# Patient Record
Sex: Female | Born: 1975 | Race: White | Hispanic: No | Marital: Married | Smoking: Former smoker
Health system: Southern US, Community
[De-identification: ages and names within clinical notes are randomized; demographics above are authoritative.]

## PROBLEM LIST (undated history)

## (undated) DIAGNOSIS — F419 Anxiety disorder, unspecified: Secondary | ICD-10-CM

## (undated) HISTORY — PX: APPENDECTOMY: SHX54

## (undated) HISTORY — DX: Anxiety disorder, unspecified: F41.9

---

## 2015-10-15 ENCOUNTER — Ambulatory Visit: Payer: Self-pay | Admitting: Internal Medicine

## 2016-02-04 ENCOUNTER — Ambulatory Visit (HOSPITAL_COMMUNITY)
Admission: EM | Admit: 2016-02-04 | Discharge: 2016-02-04 | Disposition: A | Discharge status: Home / Self Care | Payer: Managed Care, Other (non HMO) | Attending: Internal Medicine | Admitting: Internal Medicine

## 2016-02-04 ENCOUNTER — Encounter (HOSPITAL_COMMUNITY): Payer: Self-pay | Admitting: Emergency Medicine

## 2016-02-04 DIAGNOSIS — J028 Acute pharyngitis due to other specified organisms: Secondary | ICD-10-CM | POA: Diagnosis not present

## 2016-02-04 MED ORDER — AMOXICILLIN 500 MG PO CAPS: 500.0000 mg | ORAL_CAPSULE | Freq: Three times a day (TID) | ORAL | 0 refills | Status: DC

## 2016-02-04 NOTE — ED Provider Notes (Signed)
CSN: [id number]     Arrival date & time 02/04/16  1710 History   None    Chief Complaint  Patient presents with  . Sore Throat   (Consider location/radiation/quality/duration/timing/severity/associated sxs/prior Treatment) The history is provided by the patient. No language interpreter was used.  Sore Throat  This is a new problem. The current episode started more than 2 days ago. The problem occurs constantly. The problem has been gradually worsening. Nothing aggravates the symptoms. Nothing relieves the symptoms. She has tried nothing for the symptoms. The treatment provided no relief.  Pt complains of a sore throat since Sunday.  Pt's husband has strep  Past Medical History:  Diagnosis Date  . Anxiety    Past Surgical History:  Procedure Laterality Date  . APPENDECTOMY     No family history on file. Social History  Substance Use Topics  . Smoking status: Former Research scientist (life sciences)  . Smokeless tobacco: Not on file  . Alcohol use 0.0 oz/week     Comment: rarely   OB History    No data available     Review of Systems  All other systems reviewed and are negative.   Allergies  Review of patient's allergies indicates no known allergies.  Home Medications   Prior to Admission medications   Medication Sig Start Date End Date Taking? Authorizing Provider  ibuprofen (ADVIL,MOTRIN) 200 MG tablet Take 400 mg by mouth every 6 (six) hours as needed for fever.   Yes Historical Provider, MD   Meds Ordered and Administered this Visit  Medications - No data to display  BP 104/76 (BP Location: Left Arm)   Pulse 80   Temp 98.1 F (36.7 C) (Oral)   SpO2 100%  No data found.   Physical Exam  Constitutional: She is oriented to person, place, and time. She appears well-developed and well-nourished.  HENT:  Head: Normocephalic.  Erythema posterior pharynx.  No esudate  Eyes: EOM are normal.  Neck: Normal range of motion.  Pulmonary/Chest: Effort normal.  Abdominal: She exhibits no  distension.  Musculoskeletal: Normal range of motion.  Neurological: She is alert and oriented to person, place, and time.  Psychiatric: She has a normal mood and affect.  Nursing note and vitals reviewed.   Urgent Care Course   Clinical Course    Procedures (including critical care time)  Labs Review Labs Reviewed - No data to display  Imaging Review No results found.   Visual Acuity Review  Right Eye Distance:   Left Eye Distance:   Bilateral Distance:    Right Eye Near:   Left Eye Near:    Bilateral Near:         MDM   1. Pharyngitis due to other organism    An After Visit Summary was printed and given to the patient. Meds ordered this encounter  Medications  . ibuprofen (ADVIL,MOTRIN) 200 MG tablet    Sig: Take 400 mg by mouth every 6 (six) hours as needed for fever.  Marland Kitchen amoxicillin (AMOXIL) 500 MG capsule    Sig: Take 1 capsule (500 mg total) by mouth 3 (three) times daily.    Dispense:  30 capsule    Refill:  0    Order Specific Question:   Supervising Provider    Answer:   Ihor Gully D Burgoon, PA-C 02/04/16 1757

## 2016-02-04 NOTE — ED Triage Notes (Signed)
Pt has been suffering from a sore throat since Sunday.  She states that her daughter and husband both have or had strep throat in the last two weeks.  Pt reports an unmeasured fever at home.

## 2016-06-03 ENCOUNTER — Encounter (INDEPENDENT_AMBULATORY_CARE_PROVIDER_SITE_OTHER): Payer: Self-pay | Admitting: Surgery

## 2016-06-03 ENCOUNTER — Ambulatory Visit (INDEPENDENT_AMBULATORY_CARE_PROVIDER_SITE_OTHER): Payer: Self-pay

## 2016-06-03 ENCOUNTER — Ambulatory Visit (INDEPENDENT_AMBULATORY_CARE_PROVIDER_SITE_OTHER): Payer: Managed Care, Other (non HMO) | Admitting: Surgery

## 2016-06-03 VITALS — BP 109/72 | HR 76 | Ht 66.93 in | Wt 132.3 lb

## 2016-06-03 DIAGNOSIS — M5441 Lumbago with sciatica, right side: Secondary | ICD-10-CM

## 2016-06-03 DIAGNOSIS — M25551 Pain in right hip: Secondary | ICD-10-CM | POA: Diagnosis not present

## 2016-06-03 DIAGNOSIS — G8929 Other chronic pain: Secondary | ICD-10-CM

## 2016-06-03 MED ORDER — METHYLPREDNISOLONE 4 MG PO TABS: ORAL_TABLET | ORAL | 0 refills | Status: DC

## 2016-06-03 MED ORDER — METHOCARBAMOL 500 MG PO TABS: 500.0000 mg | ORAL_TABLET | Freq: Three times a day (TID) | ORAL | 0 refills | Status: DC | PRN

## 2016-06-03 NOTE — Progress Notes (Signed)
Office Visit Note   Patient: Mary Boyle           Date of Birth: 1975-06-10           MRN: [id number] Visit Date: 06/03/2016              Requested by: No referring provider defined for this encounter. PCP: No PCP Per Patient   Assessment & Plan: Visit Diagnoses:  1. Chronic right-sided low back pain with right-sided sciatica     Plan: Patient given prescriptions for Medrol Dosepak 6 day taper to be taken as directed and Robaxin she will also go to a couple of visits of formal PT to be given a home exercise program. Follow-up in 2 weeks for recheck. If she continues to be symptomatic we will schedule a lumbar spine MRI to rule out HNP/stenosis. All questions answered.  Follow-Up Instructions: Return in about 2 weeks (around 06/17/2016) for [facility].   Orders:  Orders Placed This Encounter  Procedures  . XR Pelvis 1-2 Views  . XR Lumbar Spine 2-3 Views   Meds ordered this encounter  Medications  . methylPREDNISolone (MEDROL) 4 MG tablet    Sig: 6 day taper to be taken as directed    Dispense:  21 tablet    Refill:  0  . methocarbamol (ROBAXIN) 500 MG tablet    Sig: Take 1 tablet (500 mg total) by mouth every 8 (eight) hours as needed for muscle spasms.    Dispense:  50 tablet    Refill:  0      Procedures: No procedures performed   Clinical Data: No additional findings.   Subjective: Chief Complaint  Patient presents with  . Lower Back - Pain    Patient presents with low back pain that radiates down to right knee. She states that she has had back pain that comes and goes since the birth of her second daughter in 2014. The pain has become worse and constant over the last two weeks. She went to a chiropractor and tried a yoga class which made it worse. She has tried ibuprofen and voltaren gel with no relief.   States that she's had off-and-on pain in her low back for a few years that was aggravated with certain activities. Mostly aggravated with prolonged  sitting. The last couple weeks she seen a chiropractor without any improvement. States that she has pain in the right lower back that radiates into her right buttock down to her knee. Again this is also aggravated with sitting. Not so bad with walking and lying flat. No symptoms in the left leg. She does yoga but some techniques to aggravate her back.  Review of Systems  Constitutional: Positive for activity change.  HENT: Negative.   Eyes: Negative.   Respiratory: Negative.   Cardiovascular: Negative.   Gastrointestinal: Negative.   Endocrine: Negative.   Genitourinary: Negative.   Allergic/Immunologic: Negative.   Psychiatric/Behavioral: Negative.      Objective: Vital Signs: BP 109/72   Pulse 76   Ht 5' 6.93" (1.7 m)   Wt 132 lb 4.4 oz (60 kg)   BMI 20.76 kg/m   Physical Exam  Constitutional: She is oriented to person, place, and time. No distress.  HENT:  Head: Normocephalic and atraumatic.  Eyes: EOM are normal. Pupils are equal, round, and reactive to light.  Neck: Normal range of motion.  Pulmonary/Chest: No respiratory distress.  Abdominal: She exhibits no distension.  Musculoskeletal:  Gait is normal. She has good  lumbar flexion and extension with hands to ankles. With flexion she does have some discomfort in the right low back. Mild right lumbar paraspinal tenderness. Negative logroll bilateral hips. Right straight leg raise causes pain in the central lumbar spine but nothing down the leg. Neurovascularly intact. No focal motor deficits. Bilateral calves are nontender.  Neurological: She is alert and oriented to person, place, and time.  Skin: Skin is warm and dry.  Psychiatric: She has a normal mood and affect.    Ortho Exam  Specialty Comments:  No specialty comments available.  Imaging: Xr Lumbar Spine 2-3 Views  Result Date: 06/03/2016 X-rays lumbar spine shows some narrowing of the L5-S1 disc space. She does have a few millimeters of L4 retrolisthesis that  does show some movement with flexion and extension. No acute findings.  Xr Pelvis 1-2 Views  Result Date: 06/03/2016 X-ray right hip shows good bony anatomy. Joint space maintained. No acute findings.    PMFS History: There are no active problems to display for this patient.  Past Medical History:  Diagnosis Date  . Anxiety     History reviewed. No pertinent family history.  Past Surgical History:  Procedure Laterality Date  . APPENDECTOMY     Social History   Occupational History  . Not on file.   Social History Main Topics  . Smoking status: Former Research scientist (life sciences)  . Smokeless tobacco: Never Used  . Alcohol use 0.0 oz/week     Comment: rarely  . Drug use: No  . Sexual activity: Not on file

## 2016-06-08 ENCOUNTER — Ambulatory Visit: Payer: Managed Care, Other (non HMO) | Attending: Orthopaedic Surgery | Admitting: Physical Therapy

## 2016-06-08 DIAGNOSIS — R293 Abnormal posture: Secondary | ICD-10-CM

## 2016-06-08 DIAGNOSIS — M5417 Radiculopathy, lumbosacral region: Secondary | ICD-10-CM | POA: Diagnosis not present

## 2016-06-08 DIAGNOSIS — M25651 Stiffness of right hip, not elsewhere classified: Secondary | ICD-10-CM

## 2016-06-08 NOTE — Therapy (Signed)
[place], [place], [address] Phone: [phone]   Fax:  [phone]  Physical Therapy Evaluation  Patient Details  Name: Mary Boyle MRN: [id number] Date of Birth: 1975-08-05 Referring Provider: Dr. Lorin Mercy  Encounter Date: 06/08/2016      PT End of Session - 06/08/16 1743    Visit Number 1   Number of Visits 8   Date for PT Re-Evaluation 07/13/16   PT Start Time 0800   PT Stop Time 0855   PT Time Calculation (min) 55 min   Activity Tolerance Patient tolerated treatment well   Behavior During Therapy [facility] for tasks assessed/performed      Past Medical History:  Diagnosis Date  . Anxiety     Past Surgical History:  Procedure Laterality Date  . APPENDECTOMY      There were no vitals filed for this visit.       Subjective Assessment - 06/08/16 0807    Subjective This patient reports long history of intermittent back pain.  She states her pain was worse after having her children. 3 weeks ago she had severe pain.    Yoga and chiropractic may have exacerbated.   She has radiating pain in Rt. LE, Rt. hip to knee. Mild numbness and tingling.  Stiff in prox. hip. She has difficulty sitting >standing, drivning in the car.     Pertinent History --   Limitations Sitting;Lifting;House hold activities;Other (comment)   Diagnostic tests XR showed retrolisthesis    Currently in Pain? Yes   Pain Score 4    Pain Location Back  HIp    Pain Orientation Right   Pain Descriptors / Indicators Tightness;Pressure   Pain Type Chronic pain   Pain Onset More than a month ago   Pain Frequency Intermittent   Aggravating Factors  sitting   Pain Relieving Factors heating pad, meds, Voltaren , has not been as effectiv as it has in the past.             [facility] PT Assessment - 06/08/16 0818      Assessment   Medical Diagnosis chronic low back pain with Rt. sciatica    Referring Provider Dr. Lorin Mercy   Onset  Date/Surgical Date --  acute on chronic 3 weeks    Prior Therapy In [place]     Precautions   Precautions None     Restrictions   Weight Bearing Restrictions No     Balance Screen   Has the patient fallen in the past 6 months No     Prior Function   Level of Independence Independent   Vocation Other (comment)   Vocation Requirements professor, research   Leisure playing with daughters, traveling     Cognition   Overall Cognitive Status Within Functional Limits for tasks assessed     Observation/Other Assessments   Focus on Therapeutic Outcomes (FOTO)  44%     Sensation   Light Touch Appears Intact     Posture/Postural Control   Posture/Postural Control Postural limitations   Postural Limitations Rounded Shoulders;Forward head;Decreased lumbar lordosis   Posture Comments Rt. knee valgus, patella face inward bilaterally, R>L.  In supine Rt. ASIS hgher      AROM   Lumbar Flexion 80   Lumbar Extension 40   Lumbar - Right Side Bend pain on R    Lumbar - Left Side Bend pain on R    Lumbar - Right Rotation 25% pain    Lumbar - Left  Rotation WNL      PROM   Overall PROM Comments Rt. hamstring to 55 deg, Lt 70 deg.      Strength   Right/Left Hip --  WNL    Right Hip ABduction 4+/5   Left Hip ABduction 5/5   Right/Left Knee --  WNL      Flexibility   Hamstrings 90/90 test  Rt. lacks 35-40 deg, L. 20-25 deg     Palpation   Palpation comment tender Rt. glute med and L5-S1 , min in lateral border SIJ      Special Tests    Special Tests --  neg Toy Care                   [facility] Adult PT Treatment/Exercise - 06/08/16 0818      Self-Care   Self-Care Heat/Ice Application;Other Self-Care Comments   Heat/Ice Application MHP    Other Self-Care Comments  try short 30 min session of Yoga      Lumbar Exercises: Stretches   Active Hamstring Stretch 2 reps;30 seconds   Lower Trunk Rotation 10 seconds   Lower Trunk Rotation Limitations x 10      Knee/Hip  Exercises: Stretches   ITB Stretch Both;2 reps     Moist Heat Therapy   Number Minutes Moist Heat 10 Minutes   Moist Heat Location Lumbar Spine                PT Education - 06/08/16 1958    Education provided Yes   Education Details PT/POC, HEP, retrolisthesis, posture, core    Person(s) Educated Patient   Methods Explanation;Demonstration;Verbal cues;Handout;Tactile cues   Comprehension Verbalized understanding;Returned demonstration;Need further instruction          PT Short Term Goals - 06/08/16 2001      PT SHORT TERM GOAL #1   Title STG =LTG           PT Long Term Goals - 06/08/16 2001      PT LONG TERM GOAL #1   Title Pt will be I with HEP for hips, core strength, flexibility and posture.    Time 4   Period Weeks   Status New     PT LONG TERM GOAL #2   Title Pt will score less than 30% impaired on FOTO to demo functional improvement.    Time 4   Period Weeks   Status New     PT LONG TERM GOAL #3   Title Pt wil demo lifting 30lbs from floor to waist height with good mechanics to prevent reinjury.    Time 4   Period Weeks   Status New     PT LONG TERM GOAL #4   Title Pt will sit for work, driving up to 30 min without increase in LE pain.    Time 4   Period Weeks   Status New     PT LONG TERM GOAL #5   Title Pt will report pain overall with daily activities <2/10 most of the time.    Time 4   Period Weeks   Status New               Plan - 06/08/16 1744    Clinical Impression Statement Patient presetns for low complexity eval of chronic intermittent radicular low back pain . Rt leg pain and sensory sx with sitting and about 2-3 weeks ago was constant.  Overall she is improving.  Possibly rotated in Rt. ilium but  may be improved with simple stretches given today.   She will do well with 4-8 PT sessions to teach proper recruitment of core mm, behavior and body mech, behavior modifcation.    Rehab Potential Excellent   PT Frequency 2x  / week   PT Duration 6 weeks   PT Treatment/Interventions Moist Heat;Manual techniques;Therapeutic exercise;Ultrasound;Balance training;Electrical Stimulation;Functional mobility training;Patient/family education;Passive range of motion;Neuromuscular re-education   PT Next Visit Plan check flexibility HEP and add in core.  try manual for Rt. hip, consider SIJ   PT Home Exercise Plan hamstring, piriformis, ITB    Consulted and Agree with Plan of Care Patient      Patient will benefit from skilled therapeutic intervention in order to improve the following deficits and impairments:  Pain, Decreased strength, Postural dysfunction, Impaired flexibility, Decreased range of motion, Increased fascial restricitons, Decreased mobility  Visit Diagnosis: Radiculopathy, lumbosacral region  Abnormal posture  Stiffness of right hip, not elsewhere classified     Problem List There are no active problems to display for this patient.   PAA,JENNIFER 06/08/2016, 8:07 PM  [place], [place], [address] Phone: [phone]   Fax:  [phone]  Name: Mary Boyle MRN: [id number] Date of Birth: [birth year]   Raeford Razor, PT 06/08/16 8:08 PM Phone: [phone] Fax: [phone]

## 2016-06-14 ENCOUNTER — Ambulatory Visit: Payer: Managed Care, Other (non HMO) | Admitting: Physical Therapy

## 2016-06-21 ENCOUNTER — Ambulatory Visit: Payer: Managed Care, Other (non HMO) | Admitting: Physical Therapy

## 2016-06-21 DIAGNOSIS — M5417 Radiculopathy, lumbosacral region: Secondary | ICD-10-CM

## 2016-06-21 DIAGNOSIS — M25651 Stiffness of right hip, not elsewhere classified: Secondary | ICD-10-CM

## 2016-06-21 DIAGNOSIS — R293 Abnormal posture: Secondary | ICD-10-CM

## 2016-06-21 NOTE — Therapy (Signed)
[place], [place], [address] Phone: [phone]   Fax:  [phone]  Physical Therapy Treatment  Patient Details  Name: Mary Boyle MRN: [id number] Date of Birth: 04-05-1976 Referring Provider: Dr. Lorin Mercy  Encounter Date: 06/21/2016      PT End of Session - 06/21/16 1030    Visit Number 2   Number of Visits 8   Date for PT Re-Evaluation 07/13/16   PT Start Time 0845   PT Stop Time 0940   PT Time Calculation (min) 55 min   Activity Tolerance Patient tolerated treatment well   Behavior During Therapy [facility] for tasks assessed/performed      Past Medical History:  Diagnosis Date  . Anxiety     Past Surgical History:  Procedure Laterality Date  . APPENDECTOMY      There were no vitals filed for this visit.      Subjective Assessment - 06/21/16 0847    Subjective Yesterday was very painful, weekend was OK.  I ran Sunday and they may have aggravted it.     Currently in Pain? Yes   Pain Score 3    Pain Location Hip   Pain Orientation Right   Pain Descriptors / Indicators Tightness   Pain Type Chronic pain   Pain Onset More than a month ago   Pain Frequency Intermittent   Aggravating Factors  sitting, driving in the car                          [facility] Adult PT Treatment/Exercise - 06/21/16 0852      Lumbar Exercises: Stretches   Active Hamstring Stretch 2 reps;30 seconds   Lower Trunk Rotation 10 seconds   Lower Trunk Rotation Limitations x 10      Lumbar Exercises: Aerobic   Stationary Bike level 3 for 6 min warm up      Lumbar Exercises: Prone   Other Prone Lumbar Exercises Tr A in prone x 10    Other Prone Lumbar Exercises add in pelvic press series: knee flexion and hip ext for stabilization      Lumbar Exercises: Quadruped   Straight Leg Raise 5 reps   Opposite Arm/Leg Raise Right arm/Left leg;Left arm/Right leg;10 reps   Other Quadruped Lumbar Exercises childs  pose    Other Quadruped Lumbar Exercises much more challenging to Rt. multifidus/stab with Rt. leg extended      Knee/Hip Exercises: Stretches   ITB Stretch Both;2 reps   Other Knee/Hip Stretches sidelying Rt. trunk stretch over pressure      Moist Heat Therapy   Number Minutes Moist Heat 10 Minutes   Moist Heat Location Lumbar Spine;Hip     Manual Therapy   Manual Therapy Joint mobilization   Joint Mobilization ant hip mob in prone, PROM to Rt. hip in ER and IR , long axis distraction in prone to Rt. LE                 PT Education - 06/21/16 0858    Education provided Yes   Education Details HEP review and core    Person(s) Educated Patient   Methods Explanation;Demonstration   Comprehension Verbalized understanding;Returned demonstration          PT Short Term Goals - 06/08/16 2001      PT SHORT TERM GOAL #1   Title STG =LTG           PT Long Term  Goals - 06/21/16 1033      PT LONG TERM GOAL #1   Title Pt will be I with HEP for hips, core strength, flexibility and posture.    Status On-going     PT LONG TERM GOAL #2   Title Pt will score less than 30% impaired on FOTO to demo functional improvement.    Status On-going     PT LONG TERM GOAL #3   Title Pt wil demo lifting 30lbs from floor to waist height with good mechanics to prevent reinjury.    Status On-going     PT LONG TERM GOAL #4   Title Pt will sit for work, driving up to 30 min without increase in LE pain.    Status On-going     PT LONG TERM GOAL #5   Title Pt will report pain overall with daily activities <2/10 most of the time.    Status On-going               Plan - 06/21/16 1030    Clinical Impression Statement Pt continues to improve.  Noticed tightness in Rt. hip ER >Lt. today, addressed with manual and stretching.  Core work in quadruped was revealing about weakness of Rt. multifidus.  Needs cues to avoid thoracic flexion to engage abdominals and also to avoid  hyperextension of Lumbar spine with hip extension.  Advised to stop running as she does not seem to have adequate stability of spine. Seeing privte Yoga instructor as well.     PT Next Visit Plan core stability  try manual for Rt. hip, consider SIJ   PT Home Exercise Plan hamstring, piriformis, ITB    Consulted and Agree with Plan of Care Patient      Patient will benefit from skilled therapeutic intervention in order to improve the following deficits and impairments:  Pain, Decreased strength, Postural dysfunction, Impaired flexibility, Decreased range of motion, Increased fascial restricitons, Decreased mobility  Visit Diagnosis: Radiculopathy, lumbosacral region  Abnormal posture  Stiffness of right hip, not elsewhere classified     Problem List There are no active problems to display for this patient.   Nevia Henkin 06/21/2016, 10:36 AM  [place], [place], [address] Phone: [phone]   Fax:  [phone]  Name: Cathy Ravert MRN: [id number] Date of Birth: 06/16/1975  Raeford Razor, PT 06/21/16 10:37 AM Phone: [phone] Fax: [phone]

## 2016-06-21 NOTE — Patient Instructions (Signed)
Bracing With Leg Raise (Quadruped)    On hands and knees find neutral spine. Tighten pelvic floor and abdominals and hold. Alternating legs, straighten and lift to hip level. Repeat _10__ times. Do 1___ times a day.   Copyright  VHI. All rights reserved.    Healthy Back Strengthening - Back Extension on All Fours    Start on hands and knees, keeping them apart. Straighten right leg and left arm at the same time. Hold __5__ seconds. Switch immediately and repeat with left leg and right arm. Do ___10_ times. Increase repetitions gradually up to ____. Increase each hold gradually up to ___10_ seconds.  Copyright  VHI. All rights reserved.

## 2016-06-22 ENCOUNTER — Ambulatory Visit (INDEPENDENT_AMBULATORY_CARE_PROVIDER_SITE_OTHER): Payer: Managed Care, Other (non HMO) | Admitting: Orthopaedic Surgery

## 2016-06-28 ENCOUNTER — Ambulatory Visit: Payer: Managed Care, Other (non HMO) | Attending: Orthopaedic Surgery | Admitting: Physical Therapy

## 2016-06-28 DIAGNOSIS — M5417 Radiculopathy, lumbosacral region: Secondary | ICD-10-CM | POA: Diagnosis present

## 2016-06-28 DIAGNOSIS — M25651 Stiffness of right hip, not elsewhere classified: Secondary | ICD-10-CM | POA: Diagnosis present

## 2016-06-28 DIAGNOSIS — R293 Abnormal posture: Secondary | ICD-10-CM | POA: Insufficient documentation

## 2016-06-28 NOTE — Patient Instructions (Addendum)
Shoulder bridge with band around thighs to incr ER strength

## 2016-06-28 NOTE — Therapy (Addendum)
[place], [place], [address] Phone: [phone]   Fax:  [phone]  Physical Therapy Treatment and Discharge   Patient Details  Name: Mary Boyle MRN: [id number] Date of Birth: 20-May-1975 Referring Provider: Dr. Lorin Mercy  Encounter Date: 06/28/2016      PT End of Session - 06/28/16 0855    Visit Number 3   Number of Visits 8   Date for PT Re-Evaluation 07/13/16   PT Start Time 0849   PT Stop Time 0935   PT Time Calculation (min) 46 min   Activity Tolerance Patient tolerated treatment well   Behavior During Therapy [facility] for tasks assessed/performed      Past Medical History:  Diagnosis Date  . Anxiety     Past Surgical History:  Procedure Laterality Date  . APPENDECTOMY      There were no vitals filed for this visit.      Subjective Assessment - 06/28/16 0850    Subjective Pain has moved to lumbar spine Rt. lateral.  Was sitting in office for 4 hours.  This weekend was fine, original pain is different.     Currently in Pain? Yes   Pain Score 4    Pain Location Back   Pain Orientation Right   Pain Descriptors / Indicators Pressure   Pain Type Chronic pain   Pain Onset More than a month ago   Pain Frequency Intermittent   Aggravating Factors  sitting   Pain Relieving Factors exercises are helping               [facility] Adult PT Treatment/Exercise - 06/28/16 0903      Ambulation/Gait   Gait Comments assessment of LE alignment      Posture/Postural Control   Posture Comments Rt femur IR      Lumbar Exercises: Supine   Ab Set 10 reps   Clam 10 reps   Clam Limitations unilateal and bilateral    Heel Slides 10 reps   Bent Knee Raise 10 reps   Bridge --   Straight Leg Raise 10 reps     Manual Therapy   Soft tissue mobilization Rt. QL    Myofascial Release Rt. trunk in sidelying over pillow to stretch caudal pressure to iliac crest       Pilates Reformer used for LE/core  strength, postural strength, lumbopelvic disassociation and core control.  Exercises included:  Footwork 2 Red 1 blue heels used ball x 10 and used green band to encourage Rt. Hip ER and proper alignment.  Single leg work as well, needed mod cues to maintain stable pelvis and lumbar spine.  Bridging 3 springs with band around thighs  Felt good to patient            PT Education - 06/28/16 0941    Education provided Yes   Education Details LE alignment and core    Person(s) Educated Patient   Methods Explanation   Comprehension Verbalized understanding;Returned demonstration          PT Short Term Goals - 06/08/16 2001      PT SHORT TERM GOAL #1   Title STG =LTG           PT Long Term Goals - 06/28/16 0944      PT LONG TERM GOAL #1   Title Pt will be I with HEP for hips, core strength, flexibility and posture.    Status On-going     PT LONG TERM GOAL #  2   Title Pt will score less than 30% impaired on FOTO to demo functional improvement.    Status On-going     PT LONG TERM GOAL #3   Title Pt wil demo lifting 30lbs from floor to waist height with good mechanics to prevent reinjury.    Status On-going     PT LONG TERM GOAL #4   Title Pt will sit for work, driving up to 30 min without increase in LE pain.    Baseline pain in L spine but less in hip /leg    Status Partially Met     PT LONG TERM GOAL #5   Title Pt will report pain overall with daily activities <2/10 most of the time.    Baseline improving    Status On-going               Plan - 06/28/16 7322    Clinical Impression Statement Pt made aware of Rt. LE alignment issues, used mirror to assist.  Rt. femur rotates inward with gait.  Compensates with lumbar spine.  Used Pilates Reformer for Proprioception and stabilization of pelvis.      PT Next Visit Plan core stability  try manual for Rt. hip, consider SIJ   PT Home Exercise Plan hamstring, piriformis, ITB , bridge with band    Consulted and  Agree with Plan of Care Patient      Patient will benefit from skilled therapeutic intervention in order to improve the following deficits and impairments:  Pain, Decreased strength, Postural dysfunction, Impaired flexibility, Decreased range of motion, Increased fascial restricitons, Decreased mobility  Visit Diagnosis: Radiculopathy, lumbosacral region  Abnormal posture  Stiffness of right hip, not elsewhere classified     Problem List There are no active problems to display for this patient.   PAA,JENNIFER 06/28/2016, 9:50 AM  [facility] [address] [place], [place], [address] Phone: [phone]   Fax:  [phone]  Name: Mary Boyle MRN: [id number] Date of Birth: [birth year]  Raeford Razor, PT 06/28/16 9:52 AM Phone: [phone] Fax: [phone]  PHYSICAL THERAPY DISCHARGE SUMMARY  Visits from Start of Care: 3  Current functional level related to goals / functional outcomes: See above for most recent info   Remaining deficits: See above, pain and postural imbalance, hip weakness    Education / Equipment: HEP, Pilates, safety with yoga and other exercises, body mechanics , posture  Plan: Patient agrees to discharge.  Patient goals were partially met. Patient is being discharged due to not returning since the last visit.  ?????    Patient did not call, reschedule appt.   Raeford Razor, PT 08/03/16 3:01 PM Phone: [phone] Fax: [phone]

## 2016-07-05 ENCOUNTER — Ambulatory Visit: Payer: Managed Care, Other (non HMO) | Admitting: Physical Therapy

## 2016-07-11 ENCOUNTER — Ambulatory Visit: Payer: Managed Care, Other (non HMO) | Admitting: Physical Therapy

## 2016-08-03 ENCOUNTER — Ambulatory Visit (INDEPENDENT_AMBULATORY_CARE_PROVIDER_SITE_OTHER): Payer: Managed Care, Other (non HMO) | Admitting: Orthopaedic Surgery

## 2016-08-03 ENCOUNTER — Encounter (INDEPENDENT_AMBULATORY_CARE_PROVIDER_SITE_OTHER): Payer: Self-pay | Admitting: Orthopaedic Surgery

## 2016-08-03 VITALS — BP 117/74 | HR 64 | Ht 66.93 in | Wt 132.3 lb

## 2016-08-03 DIAGNOSIS — M545 Low back pain: Secondary | ICD-10-CM | POA: Diagnosis not present

## 2016-08-03 NOTE — Progress Notes (Signed)
Office Visit Note   Patient: Mary Boyle           Date of Birth: [birth year]           MRN: [id number] Visit Date: 08/03/2016              Requested by: No referring provider defined for this encounter. PCP: No PCP Per Patient   Assessment & Plan: Visit Diagnoses:  1. Acute right-sided low back pain, with sciatica presence unspecified     Plan: Patient's about 75% better. Certain workout activity still bothered to some degree. We discussed activity modification. She is doing her yoga and states that the remaining discomfort she can handle. We reviewed her MRI that showed some disc space narrowing at L5-S1 with trace listhesis. She can return if she gets increase in her symptoms.  Follow-Up Instructions: Return if symptoms worsen or fail to improve.   Orders:  No orders of the defined types were placed in this encounter.  No orders of the defined types were placed in this encounter.     Procedures: No procedures performed   Clinical Data: No additional findings.   Subjective: Chief Complaint  Patient presents with  . Lower Back - Pain    HPI patient still having some right sided low back pain and right leg pain. She states a few weeks after the prednisone pack she got better. She's gotten 75% relief in his back doing yoga. She denies bowel bladder symptoms no fever chills. She notices when she stretches her hamstring on the right leg she still has some pain that radiates into her thigh and down into her leg. She is doing normal activities of daily living without symptoms.  Review of Systems 14 point review of systems updated and is unchanged from 06/03/2016 office visit other than as stated above. Activity level is improved she 75% pain relief.   Objective: Vital Signs: BP 117/74   Pulse 64   Ht 5' 6.93" (1.7 m)   Wt 132 lb 4.4 oz (60 kg)   BMI 20.76 kg/m   Physical Exam  Constitutional: She is oriented to person, place, and time. She appears well-developed.   HENT:  Head: Normocephalic.  Right Ear: External ear normal.  Left Ear: External ear normal.  Eyes: Pupils are equal, round, and reactive to light.  Neck: No tracheal deviation present. No thyromegaly present.  Cardiovascular: Normal rate.   Pulmonary/Chest: Effort normal.  Abdominal: Soft.  Musculoskeletal:  Reflexes are intact pelvis is level there siding notch tenderness on the right none on the left. She still has some tenderness along the sciatic nerve and the posterior thigh with straight leg raising. Positive pop to compression test. No gastrocsoleus or anterior tib weakness. Sensation of the foot is intact. Peroneal nerve at the knee is normal. Normal hip range of motion negative Faber test. No rash or exposed skin.  Neurological: She is alert and oriented to person, place, and time.  Skin: Skin is warm and dry.  Psychiatric: She has a normal mood and affect. Her behavior is normal.    Ortho Exam  Specialty Comments:  No specialty comments available.  Imaging: No results found.   PMFS History: There are no active problems to display for this patient.  Past Medical History:  Diagnosis Date  . Anxiety     No family history on file.  Past Surgical History:  Procedure Laterality Date  . APPENDECTOMY     Social History   Occupational History  .  Not on file.   Social History Main Topics  . Smoking status: Former Research scientist (life sciences)  . Smokeless tobacco: Never Used  . Alcohol use 0.0 oz/week     Comment: rarely  . Drug use: No  . Sexual activity: Not on file

## 2016-09-05 ENCOUNTER — Ambulatory Visit (INDEPENDENT_AMBULATORY_CARE_PROVIDER_SITE_OTHER): Payer: Managed Care, Other (non HMO) | Admitting: Licensed Clinical Social Worker

## 2016-09-05 DIAGNOSIS — F419 Anxiety disorder, unspecified: Secondary | ICD-10-CM | POA: Diagnosis not present

## 2017-02-11 ENCOUNTER — Encounter (HOSPITAL_COMMUNITY): Payer: Self-pay | Admitting: Emergency Medicine

## 2017-02-11 ENCOUNTER — Ambulatory Visit (HOSPITAL_COMMUNITY)
Admission: EM | Admit: 2017-02-11 | Discharge: 2017-02-11 | Disposition: A | Discharge status: Home / Self Care | Payer: Managed Care, Other (non HMO) | Attending: Family Medicine | Admitting: Family Medicine

## 2017-02-11 DIAGNOSIS — M5441 Lumbago with sciatica, right side: Secondary | ICD-10-CM

## 2017-02-11 MED ORDER — METHYLPREDNISOLONE SODIUM SUCC 125 MG IJ SOLR
INTRAMUSCULAR | Status: AC
  Filled 2017-02-11: qty 2

## 2017-02-11 MED ORDER — METHYLPREDNISOLONE SODIUM SUCC 125 MG IJ SOLR
125.0000 mg | Freq: Once | INTRAMUSCULAR | Status: AC
  Administered 2017-02-11: 125 mg via INTRAMUSCULAR

## 2017-02-11 NOTE — ED Triage Notes (Signed)
Complains of back pain, history of back issues.  7 weeks ago received her last injection for back, then did physical therapy.  Stopped this a couple of weeks ago due to resolution of issues.  Pain is back to being as bad as it was before.  Tingling in right leg and foot.

## 2017-02-13 NOTE — ED Provider Notes (Signed)
  [place]   [phone] 02/11/17 Arrival Time: 1027  ASSESSMENT & PLAN:  1. Acute right-sided low back pain with right-sided sciatica     Meds ordered this encounter  Medications  . methylPREDNISolone sodium succinate (SOLU-MEDROL) 125 mg/2 mL injection 125 mg   Ensure ROM. She plans to call her orthopaedist on Monday morning. May f/u here as needed. OTC NSAID. Reviewed expectations re: course of current medical issues. Questions answered. Outlined signs and symptoms indicating need for more acute intervention. Patient verbalized understanding. After Visit Summary given.  SUBJECTIVE:  Mary Boyle is a 41 y.o. female who presents with complaint of low back discomfort. Onset gradual beginning 2 days ago. Discomfort described as aching without radiation. Has been followed for several months secondary to ongoing LBP. Injections helping along with PT. Better two days ago until back discomfort returned. No extremity sensation changes or weakness. No injury/trauma reported. Normal bowel/bladder habits. Describes as a lower L back ache. Ambulatory without difficulty.  ROS: As per HPI.   OBJECTIVE:  Vitals:   02/11/17 1852  BP: 120/81  Pulse: 73  Resp: 18  Temp: 98.2 F (36.8 C)  TempSrc: Oral  SpO2: 98%    General appearance: alert; no distress Abdomen: soft, non-tender; bowel sounds normal; no masses or organomegaly; no guarding or rebound tenderness Back: mild discomfort over left lumbar paraspinal musculature; FROM at hips Extremities: no cyanosis or edema; symmetrical with no gross deformities Skin: warm and dry Neurologic: normal gait; normal symmetric reflexes; normal LE strength and sensation Psychological: alert and cooperative; normal mood and affect   No Known Allergies  Past Medical History:  Diagnosis Date  . Anxiety    Social History   Social History  . Marital status: Married    Spouse name: N/A  . Number of children: N/A  . Years of  education: N/A   Occupational History  . Not on file.   Social History Main Topics  . Smoking status: Former Research scientist (life sciences)  . Smokeless tobacco: Never Used  . Alcohol use 0.0 oz/week     Comment: rarely  . Drug use: No  . Sexual activity: Not on file   Other Topics Concern  . Not on file   Social History Narrative  . No narrative on file   No family history on file. Past Surgical History:  Procedure Laterality Date  . Felicita Gage, MD 02/13/17 [id number]

## 2017-03-23 DIAGNOSIS — D229 Melanocytic nevi, unspecified: Secondary | ICD-10-CM

## 2017-03-23 HISTORY — DX: Melanocytic nevi, unspecified: D22.9

## 2017-06-27 DIAGNOSIS — N644 Mastodynia: Secondary | ICD-10-CM | POA: Insufficient documentation

## 2017-06-27 DIAGNOSIS — N63 Unspecified lump in unspecified breast: Secondary | ICD-10-CM | POA: Insufficient documentation

## 2017-06-29 ENCOUNTER — Other Ambulatory Visit: Payer: Self-pay | Admitting: Gastroenterology

## 2017-06-29 DIAGNOSIS — R1084 Generalized abdominal pain: Secondary | ICD-10-CM

## 2017-06-30 ENCOUNTER — Other Ambulatory Visit: Payer: Self-pay | Admitting: Gastroenterology

## 2017-06-30 DIAGNOSIS — R140 Abdominal distension (gaseous): Secondary | ICD-10-CM

## 2017-07-17 ENCOUNTER — Ambulatory Visit
Admission: RE | Admit: 2017-07-17 | Discharge: 2017-07-17 | Disposition: A | Discharge status: Home / Self Care | Payer: Managed Care, Other (non HMO) | Source: Ambulatory Visit | Attending: Gastroenterology | Admitting: Gastroenterology

## 2017-07-17 DIAGNOSIS — R140 Abdominal distension (gaseous): Secondary | ICD-10-CM

## 2019-06-20 ENCOUNTER — Ambulatory Visit: Payer: Self-pay | Attending: Internal Medicine

## 2019-06-20 DIAGNOSIS — Z23 Encounter for immunization: Secondary | ICD-10-CM | POA: Insufficient documentation

## 2019-06-20 NOTE — Progress Notes (Signed)
   Covid-19 Vaccination Clinic  Name:  Mary Boyle    MRN: [id number] DOB: 07-11-1975  06/20/2019  [name] was observed post Covid-19 immunization for 15 minutes without incidence. She was provided with Vaccine Information Sheet and instruction to access the V-Safe system.   [name] was instructed to call 911 with any severe reactions post vaccine: Marland Kitchen Difficulty breathing  . Swelling of your face and throat  . A fast heartbeat  . A bad rash all over your body  . Dizziness and weakness    Immunizations Administered    Name Date Dose VIS Date Route   Pfizer COVID-19 Vaccine 06/20/2019  1:58 PM 0.3 mL 04/05/2019 Intramuscular   Manufacturer: [place]   Lot: [id number]   [place]: [id number]

## 2019-07-16 ENCOUNTER — Ambulatory Visit: Payer: Self-pay | Attending: Internal Medicine

## 2019-07-16 DIAGNOSIS — Z23 Encounter for immunization: Secondary | ICD-10-CM

## 2019-07-16 NOTE — Progress Notes (Signed)
   Covid-19 Vaccination Clinic  Name:  Ayshia Saint    MRN: [id number] DOB: [birth year]  07/16/2019  Ms. Harting was observed post Covid-19 immunization for 15 minutes without incident. She was provided with Vaccine Information Sheet and instruction to access the V-Safe system.   Ms. Curl was instructed to call 911 with any severe reactions post vaccine: Marland Kitchen Difficulty breathing  . Swelling of face and throat  . A fast heartbeat  . A bad rash all over body  . Dizziness and weakness   Immunizations Administered    Name Date Dose VIS Date Route   Pfizer COVID-19 Vaccine 07/16/2019  9:11 AM 0.3 mL 04/05/2019 Intramuscular   Manufacturer: [place]   Lot: [id number]   [place]: [id number]

## 2019-07-30 DIAGNOSIS — G5621 Lesion of ulnar nerve, right upper limb: Secondary | ICD-10-CM | POA: Insufficient documentation

## 2019-08-23 ENCOUNTER — Other Ambulatory Visit: Payer: Self-pay

## 2019-08-23 ENCOUNTER — Encounter: Payer: Self-pay | Admitting: Physician Assistant

## 2019-08-23 ENCOUNTER — Ambulatory Visit (INDEPENDENT_AMBULATORY_CARE_PROVIDER_SITE_OTHER): Payer: Managed Care, Other (non HMO) | Admitting: Physician Assistant

## 2019-08-23 DIAGNOSIS — D485 Neoplasm of uncertain behavior of skin: Secondary | ICD-10-CM

## 2019-08-23 DIAGNOSIS — D225 Melanocytic nevi of trunk: Secondary | ICD-10-CM | POA: Diagnosis not present

## 2019-08-23 DIAGNOSIS — Z1283 Encounter for screening for malignant neoplasm of skin: Secondary | ICD-10-CM

## 2019-08-23 NOTE — Progress Notes (Addendum)
   Follow up Visit  Subjective  Mary Boyle is a 44 y.o. female who presents for the following: Annual Exam (no concerns). History of multiple atypical nevi.   Objective  Well appearing patient in no apparent distress; mood and affect are within normal limits.  A full examination was performed including scalp, head, eyes, ears, nose, lips, neck, chest, axillae, abdomen, back, buttocks, bilateral upper extremities, bilateral lower extremities, hands, feet, fingers, toes, fingernails, and toenails. All findings within normal limits unless otherwise noted below. No suspicious moles noted on back.   Objective  Mid Back: Scatttered nevi trunk and extremities.   Objective  Right Upper Arm - Anterior: Small brown macule irregular color       Objective  TBSE  Assessment & Plan  Melanocytic nevi of trunk History of atypical nevi  Neoplasm of uncertain behavior of skin Right Upper Arm - Anterior  Skin / nail biopsy Type of biopsy: tangential   Procedure prep:  Patient was prepped and draped in usual sterile fashion (Non sterile) Prep type:  Chlorhexidine Anesthesia: the lesion was anesthetized in a standard fashion   Anesthetic:  1% lidocaine w/ epinephrine 1-100,000 local infiltration Instrument used: flexible razor blade    Specimen 1 - Surgical pathology Differential Diagnosis: r/o atypia Check Margins: No  Screening exam for skin cancer  I, Midas Daughety Clark-Bruning, PA-C, have reviewed all documentation for this visit. The documentation on 10/02/19 for the exam, diagnosis, procedures, and orders are all accurate and complete.

## 2019-08-23 NOTE — Patient Instructions (Signed)

## 2019-08-27 ENCOUNTER — Telehealth: Payer: Self-pay | Admitting: Physician Assistant

## 2019-08-27 NOTE — Telephone Encounter (Signed)
Patient calling for results of biopsy. Patient does not have answering service. [id number]

## 2019-08-28 ENCOUNTER — Encounter: Payer: Self-pay | Admitting: Physician Assistant

## 2019-08-28 NOTE — Telephone Encounter (Signed)
Pathology results to patient and recheck 6 months.

## 2019-08-28 NOTE — Telephone Encounter (Signed)
-----   Message from Arlyss Gandy, [place] sent at 08/28/2019 10:04 AM EDT ----- Mod atypia. Recheck 6 months

## 2019-09-27 ENCOUNTER — Encounter: Payer: Self-pay | Admitting: Gastroenterology

## 2019-12-03 ENCOUNTER — Encounter: Payer: Self-pay | Admitting: Gastroenterology

## 2019-12-03 ENCOUNTER — Ambulatory Visit (INDEPENDENT_AMBULATORY_CARE_PROVIDER_SITE_OTHER): Payer: Managed Care, Other (non HMO) | Admitting: Gastroenterology

## 2019-12-03 ENCOUNTER — Other Ambulatory Visit (INDEPENDENT_AMBULATORY_CARE_PROVIDER_SITE_OTHER): Payer: Managed Care, Other (non HMO)

## 2019-12-03 VITALS — BP 100/60 | HR 76 | Ht 66.0 in | Wt 110.2 lb

## 2019-12-03 DIAGNOSIS — R194 Change in bowel habit: Secondary | ICD-10-CM

## 2019-12-03 DIAGNOSIS — R140 Abdominal distension (gaseous): Secondary | ICD-10-CM | POA: Diagnosis not present

## 2019-12-03 DIAGNOSIS — K588 Other irritable bowel syndrome: Secondary | ICD-10-CM | POA: Diagnosis not present

## 2019-12-03 LAB — CBC
HCT: 40.8 % (ref 36.0–46.0)
Hemoglobin: 13.9 g/dL (ref 12.0–15.0)
MCHC: 34 g/dL (ref 30.0–36.0)
MCV: 92 fl (ref 78.0–100.0)
Platelets: 160 10*3/uL (ref 150.0–400.0)
RBC: 4.44 Mil/uL (ref 3.87–5.11)
RDW: 12.5 % (ref 11.5–15.5)
WBC: 5.6 10*3/uL (ref 4.0–10.5)

## 2019-12-03 LAB — COMPREHENSIVE METABOLIC PANEL
ALT: 9 U/L (ref 0–35)
AST: 14 U/L (ref 0–37)
Albumin: 4.4 g/dL (ref 3.5–5.2)
Alkaline Phosphatase: 31 U/L — ABNORMAL LOW (ref 39–117)
BUN: 13 mg/dL (ref 6–23)
CO2: 28 mEq/L (ref 19–32)
Calcium: 9.3 mg/dL (ref 8.4–10.5)
Chloride: 106 mEq/L (ref 96–112)
Creatinine, Ser: 0.72 mg/dL (ref 0.40–1.20)
GFR: 88.34 mL/min (ref >60.00)
Glucose, Bld: 86 mg/dL (ref 70–99)
Potassium: 3.9 mEq/L (ref 3.5–5.1)
Sodium: 139 mEq/L (ref 135–145)
Total Bilirubin: 0.8 mg/dL (ref 0.2–1.2)
Total Protein: 7.1 g/dL (ref 6.0–8.3)

## 2019-12-03 LAB — SEDIMENTATION RATE: Sed Rate: 2 mm/hr (ref 0–20)

## 2019-12-03 LAB — IGA: IgA: 173 mg/dL (ref 68–378)

## 2019-12-03 LAB — HIGH SENSITIVITY CRP: CRP, High Sensitivity: 0.42 mg/L (ref 0.000–5.000)

## 2019-12-03 LAB — TSH: TSH: 1.26 u[IU]/mL (ref 0.35–4.50)

## 2019-12-03 LAB — LIPASE: Lipase: 49 U/L (ref 11.0–59.0)

## 2019-12-03 MED ORDER — SUPREP BOWEL PREP KIT 17.5-3.13-1.6 GM/177ML PO SOLN: 1.0000 | ORAL | 0 refills | Status: DC

## 2019-12-03 NOTE — Patient Instructions (Signed)
You have been scheduled for an abdominal ultrasound at [facility] Radiology (1st floor of hospital) on 12/09/2019 at 10:00am. Please arrive 15 minutes prior to your appointment for registration. Make certain not to have anything to eat or drink 6 hours prior to your appointment. Should you need to reschedule your appointment, please contact radiology at [phone]. This test typically takes about 30 minutes to perform.  Your provider has requested that you go to the basement level for lab work before leaving today. Press "B" on the elevator. The lab is located at the first door on the left as you exit the elevator.  You have been scheduled for an endoscopy and colonoscopy. Please follow the written instructions given to you at your visit today. Please pick up your prep supplies at the pharmacy within the next 1-3 days. If you use inhalers (even only as needed), please bring them with you on the day of your procedure.  We have sent the following medications to your pharmacy for you to pick up at your convenience: [place] have been given a testing kit to check for small intestine bacterial overgrowth (SIBO) which is completed by a company named [facility]. Make sure to return your test in the mail using the return mailing label given to you along with the kit. Your demographic and insurance information have already been sent to the company and they should be in contact with you over the next week regarding this test. [facility] will collect an upfront charge of $99.74 for commercial insurance plans and $209.74 is you are paying cash. Make sure to discuss with [facility] PRIOR to having the test if they have gotten informatoin from your insurance company as to how much your testing will cost out of pocket, if any. Please keep in mind that you will be getting a call from phone number [phone] or a similar number. If you do not hear from them within this time frame, please call our  office at [phone].   Due to recent changes in healthcare laws, you may see the results of your imaging and laboratory studies on MyChart before your provider has had a chance to review them.  We understand that in some cases there may be results that are confusing or concerning to you. Not all laboratory results come back in the same time frame and the provider may be waiting for multiple results in order to interpret others.  Please give [place] 48 hours in order for your provider to thoroughly review all the results before contacting the office for clarification of your results.   Thank you for choosing me and [facility].  Dr. Rush Landmark

## 2019-12-03 NOTE — Progress Notes (Signed)
[facility] VISIT   Primary Care Provider Lois Huxley, [place] [address] [phone]  Referring Provider Lois Huxley, [address] [place],  [place] [address] [phone]  Patient Profile: Mary Boyle is a 44 y.o. female with a pmh significant for multiple atypical nevi, anxiety, status post appendectomy.  The patient presents to the [facility] for an evaluation and management of problem(s) noted below:  Problem List 1. Change in bowel habits   2. Bloating   3. Other irritable bowel syndrome     History of Present Illness This is the patient's first visit to the outpatient [facility] clinic.  The patient is previously seen Dr. Juanita Craver years ago when she moved from [place] to the [place].  Around 2017 while she was still living in [place], she began to experience abdominal discomforts.  She ended up undergoing a CT scan in [place] that was reportedly normal.  The patient that Dr. Collene Mares describes symptoms of bloating as well as increased flatulence and gas.  She also has a sensation of a lump or stones developing in her mid abdomen at times which occurs usually after eating and is more often, more frequent, in the end of the day.  The discomfort comes and goes.  If she touches the distended area the region may get slightly smaller and eventually will move along.  She reports a history of constipation based on "prior x-rays".  With that being said she describes having 1-2 formed bowel movements that are [facility] 4 in nature on a daily basis.  The patient's diet is mostly healthy including high-fiber foods, fruits and vegetables, flaxseed and protein shakes.  She minimizes dairy intake as much as possible though will have cows milk at times with a morning coffee.  For her bloating and flatulence she uses Gas-X or Beano in the past with decent effect.  She uses a [place] herbal-based remedy for gas  and it helps.  The patient reports no repeat imaging or work-up other than what was done by Dr. Collene Mares.  We do not have access to that as of yet.  The patient has never had an upper or lower endoscopy.  She has been reported to have irritable bowel syndrome.  Interestingly, having a bowel movement does not necessarily improve her discomfort.  The patient does not take significant nonsteroidals or BC/Goody powders.  GI Review of Systems Positive as above Negative for pyrosis, dysphagia, odynophagia, nausea, vomiting, melena, hematochezia  Review of Systems General: Denies fevers/chills/weight loss unintentionally (although based on the medical record she has lost 22 pounds in 4 years) HEENT: Denies oral lesions Cardiovascular: Denies chest pain Pulmonary: Denies shortness of breath Gastroenterological: See HPI Genitourinary: Denies darkened urine Hematological: Denies easy bruising/bleeding Endocrine: Denies temperature intolerance Dermatological: Denies jaundice Psychological: Mood is stable   Medications Current Outpatient Medications  Medication Sig Dispense Refill  . ergocalciferol (VITAMIN D2) 1.25 MG (50000 UT) capsule Take by mouth.    . Omega-3 1000 MG CAPS Take by mouth.    . vitamin B-12 (CYANOCOBALAMIN) 1000 MCG tablet Take 1,000 mcg by mouth daily.    . Na Sulfate-K Sulfate-Mg Sulf (SUPREP BOWEL PREP KIT) 17.5-3.13-1.6 GM/177ML SOLN Take 1 kit by mouth as directed. For colonoscopy prep 354 mL 0   No current facility-administered medications for this visit.    Allergies Allergies  Allergen Reactions  . No Known Allergies     Histories  Past Medical History:  Diagnosis Date  . Anxiety   . Atypical mole 03/23/2017   moderate atypia on mid abdomen  . Atypical mole 03/23/2017   moderate atypia on right outer breast  . Atypical mole 03/23/2017   mild atypia on right outer back  . Atypical mole 06/02/2017   moderate atypia on mid abdomen  . Atypical mole 08/23/2019    moderate right upper arm  anterior   Past Surgical History:  Procedure Laterality Date  . APPENDECTOMY     Social History   Socioeconomic History  . Marital status: Married    Spouse name: Not on file  . Number of children: Not on file  . Years of education: Not on file  . Highest education level: Not on file  Occupational History  . Not on file  Tobacco Use  . Smoking status: Former Research scientist (life sciences)  . Smokeless tobacco: Never Used  Vaping Use  . Vaping Use: Never used  Substance and Sexual Activity  . Alcohol use: Yes    Alcohol/week: 0.0 standard drinks    Comment: rarely  . Drug use: No  . Sexual activity: Not on file  Other Topics Concern  . Not on file  Social History Narrative  . Not on file   Social Determinants of Health   Financial Resource Strain:   . Difficulty of Paying Living Expenses:   Food Insecurity:   . Worried About Charity fundraiser in the Last Year:   . Arboriculturist in the Last Year:   Transportation Needs:   . Film/video editor (Medical):   Marland Kitchen Lack of Transportation (Non-Medical):   Physical Activity:   . Days of Exercise per Week:   . Minutes of Exercise per Session:   Stress:   . Feeling of Stress :   Social Connections:   . Frequency of Communication with Friends and Family:   . Frequency of Social Gatherings with Friends and Family:   . Attends Religious Services:   . Active Member of Clubs or Organizations:   . Attends Archivist Meetings:   Marland Kitchen Marital Status:   Intimate Partner Violence:   . Fear of Current or Ex-Partner:   . Emotionally Abused:   Marland Kitchen Physically Abused:   . Sexually Abused:    Family History  Problem Relation Age of Onset  . Gallstones Mother   . Gallbladder disease Mother   . Colon cancer Neg Hx   . Colon polyps Neg Hx   . Esophageal cancer Neg Hx   . Inflammatory bowel disease Neg Hx   . Liver disease Neg Hx   . Pancreatic cancer Neg Hx   . Rectal cancer Neg Hx   . Stomach cancer Neg Hx    I  have reviewed her medical, social, and family history in detail and updated the electronic medical record as necessary.    PHYSICAL EXAMINATION  BP 100/60   Pulse 76   Ht '5\' 6"'  (1.676 m)   Wt 110 lb 3.2 oz (50 kg)   BMI 17.79 kg/m  Wt Readings from Last 3 Encounters:  12/03/19 110 lb 3.2 oz (50 kg)  08/03/16 132 lb 4.4 oz (60 kg)  06/03/16 132 lb 4.4 oz (60 kg)  GEN: NAD, appears stated age, doesn't appear chronically ill PSYCH: Cooperative, without pressured speech EYE: Conjunctivae pink, sclerae anicteric ENT: MMM CV: RR without R/Gs  RESP: CTAB posteriorly, without wheezing GI: NABS, soft, NT/ND, without rebound or guarding, no HSM  appreciated MSK/EXT: No lower extremity edema SKIN: No jaundice NEURO:  Alert & Oriented x 3, no focal deficits   REVIEW OF DATA  I reviewed the following data at the time of this encounter:  GI Procedures and Studies  No relevant studies to review  Laboratory Studies  Reviewed those in epic  Imaging Studies  No relevant studies to review   ASSESSMENT  Ms. Bejar is a 44 y.o. female with a pmh significant for multiple atypical nevi, anxiety, status post appendectomy.  The patient is seen today for evaluation and management of:  1. Change in bowel habits   2. Bloating   3. Other irritable bowel syndrome    The patient is hemodynamically and clinically stable.  She has had longstanding issues of abdominal bloating discomfort at times.  She does not clearly have constipation nor am I convinced that she truly has irritable bowel syndrome.  She has a relatively healthy diet as well.  With it being said her bowel habit changes and discomforts that occur postprandially do acknowledge the need for further evaluation.  Due to her symptoms of abdominal bloating and flatulence an upper endoscopy is to be considered.  Colonoscopy to rule out obstructive pathology is also reasonable.  My pretest probability for finding something significant however is  very low.  We will try to get the previous gastroenterology work-up that Dr. Collene Mares has done in the last few years.  SIBO breath testing and exocrine pancreatic insufficiency are also to be considered and worked up.  Laboratory evaluation to rule out other potential metabolic etiologies for symptoms will be performed today.  The risks and benefits of endoscopic evaluation were discussed with the patient; these include but are not limited to the risk of perforation, infection, bleeding, missed lesions, lack of diagnosis, severe illness requiring hospitalization, as well as anesthesia and sedation related illnesses.  The patient is agreeable to proceed.  All patient questions were answered to the best of my ability, and the patient agrees to the aforementioned plan of action with follow-up as indicated.   PLAN  Laboratories as outlined below Fecal elastase evaluation to rule out EPI SIBO breath testing to be performed Abdominal ultrasound to be performed Diagnostic endoscopy (likely gastric and duodenal biopsies) Diagnostic colonoscopy form to rule out obstructive process Not clear food allergies are playing a role but certainly something to consider in future Obtain records from Dr. Lorie Apley previous work-up   Orders Placed This Encounter  Procedures  . US Abdomen Complete  . CBC  . Comp Met (CMET)  . Lipase  . TSH  . Sedimentation rate  . CRP High sensitivity  . IgA  . Tissue transglutaminase, IgA  . Pancreatic elastase, fecal  . Ambulatory referral to Gastroenterology    New Prescriptions   NA SULFATE-K SULFATE-MG SULF (SUPREP BOWEL PREP KIT) 17.5-3.13-1.6 GM/177ML SOLN    Take 1 kit by mouth as directed. For colonoscopy prep   Modified Medications   No medications on file    Planned Follow Up No follow-ups on file.   Total Time in Face-to-Face and in Coordination of Care for patient including independent/personal interpretation/review of prior testing, medical history,  examination, medication adjustment, communicating results with the patient directly, and documentation with the EHR is 45 minutes.   Justice Britain, MD [facility] Advanced [facility]oscopy Office # [phone]

## 2019-12-04 ENCOUNTER — Encounter: Payer: Self-pay | Admitting: Gastroenterology

## 2019-12-04 DIAGNOSIS — R194 Change in bowel habit: Secondary | ICD-10-CM | POA: Insufficient documentation

## 2019-12-04 DIAGNOSIS — K588 Other irritable bowel syndrome: Secondary | ICD-10-CM | POA: Insufficient documentation

## 2019-12-04 DIAGNOSIS — R140 Abdominal distension (gaseous): Secondary | ICD-10-CM | POA: Insufficient documentation

## 2019-12-04 LAB — TISSUE TRANSGLUTAMINASE, IGA: (tTG) Ab, IgA: 1 U/mL

## 2019-12-09 ENCOUNTER — Ambulatory Visit (HOSPITAL_COMMUNITY): Payer: Managed Care, Other (non HMO)

## 2019-12-12 ENCOUNTER — Other Ambulatory Visit: Payer: Self-pay

## 2019-12-12 DIAGNOSIS — R748 Abnormal levels of other serum enzymes: Secondary | ICD-10-CM

## 2019-12-13 ENCOUNTER — Ambulatory Visit (HOSPITAL_COMMUNITY)
Admission: RE | Admit: 2019-12-13 | Discharge: 2019-12-13 | Disposition: A | Discharge status: Home / Self Care | Payer: Managed Care, Other (non HMO) | Source: Ambulatory Visit | Attending: Gastroenterology | Admitting: Gastroenterology

## 2019-12-13 ENCOUNTER — Other Ambulatory Visit: Payer: Self-pay

## 2019-12-13 DIAGNOSIS — R194 Change in bowel habit: Secondary | ICD-10-CM | POA: Diagnosis not present

## 2019-12-13 DIAGNOSIS — R140 Abdominal distension (gaseous): Secondary | ICD-10-CM | POA: Insufficient documentation

## 2020-01-01 ENCOUNTER — Other Ambulatory Visit: Payer: Managed Care, Other (non HMO)

## 2020-01-01 DIAGNOSIS — R140 Abdominal distension (gaseous): Secondary | ICD-10-CM

## 2020-01-01 DIAGNOSIS — R194 Change in bowel habit: Secondary | ICD-10-CM

## 2020-01-06 LAB — PANCREATIC ELASTASE, FECAL: Pancreatic Elastase-1, Stool: >500 mcg/g

## 2020-01-07 ENCOUNTER — Other Ambulatory Visit: Payer: Self-pay | Admitting: Gastroenterology

## 2020-01-10 ENCOUNTER — Encounter: Payer: Self-pay | Admitting: Gastroenterology

## 2020-01-10 NOTE — Progress Notes (Signed)
Previous GI evaluation by Dr. Collene Mares  April 2018 Epigastric abdominal pain first visit Plan for celiac testing as well as IBgard and FD guard with plan to bring in records for prior review  May 2018 clinic visit Plan of evaluation for chronic idiopathic constipation as well as abdominal bloating as well as antireflux measures being discussed  March 2019 clinic visit Evaluation for ongoing abdominal pain. Slow transit constipation and abdominal distention with plan for a high-fiber diet as well as increased fluid intake and ultra flora IB as well as antireflux measures with plan for a CT abdomen pelvis as well January 2020 clinic visit Ongoing abdominal pain as well as reflux and bloating.  Patient given samples of restoral and VSL 3.  Slow transit constipation still present.  Colonoscopy was not recommended until the age of 20.  These records will be scanned into the chart.   Justice Britain, MD [facility] Office # [phone]

## 2020-01-14 ENCOUNTER — Encounter: Payer: Managed Care, Other (non HMO) | Admitting: Gastroenterology

## 2020-02-12 ENCOUNTER — Other Ambulatory Visit: Payer: Self-pay | Admitting: Obstetrics and Gynecology

## 2020-02-12 DIAGNOSIS — N63 Unspecified lump in unspecified breast: Secondary | ICD-10-CM

## 2020-02-14 ENCOUNTER — Ambulatory Visit
Admission: RE | Admit: 2020-02-14 | Discharge: 2020-02-14 | Disposition: A | Discharge status: Home / Self Care | Payer: Managed Care, Other (non HMO) | Source: Ambulatory Visit | Attending: Obstetrics and Gynecology | Admitting: Obstetrics and Gynecology

## 2020-02-14 ENCOUNTER — Other Ambulatory Visit: Payer: Self-pay | Admitting: Obstetrics and Gynecology

## 2020-02-14 ENCOUNTER — Other Ambulatory Visit: Payer: Self-pay

## 2020-02-14 DIAGNOSIS — N63 Unspecified lump in unspecified breast: Secondary | ICD-10-CM

## 2020-02-14 DIAGNOSIS — N6310 Unspecified lump in the right breast, unspecified quadrant: Secondary | ICD-10-CM

## 2020-02-17 ENCOUNTER — Other Ambulatory Visit: Payer: Managed Care, Other (non HMO)

## 2020-04-09 ENCOUNTER — Encounter: Payer: Managed Care, Other (non HMO) | Admitting: Gastroenterology

## 2020-05-05 ENCOUNTER — Encounter: Payer: Self-pay | Admitting: Gastroenterology

## 2020-05-05 ENCOUNTER — Ambulatory Visit (AMBULATORY_SURGERY_CENTER): Payer: Managed Care, Other (non HMO) | Admitting: Gastroenterology

## 2020-05-05 ENCOUNTER — Other Ambulatory Visit: Payer: Self-pay

## 2020-05-05 VITALS — BP 101/66 | HR 71 | Temp 98.3°F | Resp 13 | Ht 66.0 in | Wt 110.0 lb

## 2020-05-05 DIAGNOSIS — R140 Abdominal distension (gaseous): Secondary | ICD-10-CM | POA: Diagnosis not present

## 2020-05-05 DIAGNOSIS — R194 Change in bowel habit: Secondary | ICD-10-CM | POA: Diagnosis present

## 2020-05-05 DIAGNOSIS — K641 Second degree hemorrhoids: Secondary | ICD-10-CM | POA: Diagnosis not present

## 2020-05-05 DIAGNOSIS — K319 Disease of stomach and duodenum, unspecified: Secondary | ICD-10-CM | POA: Diagnosis not present

## 2020-05-05 DIAGNOSIS — K2970 Gastritis, unspecified, without bleeding: Secondary | ICD-10-CM | POA: Diagnosis not present

## 2020-05-05 DIAGNOSIS — K317 Polyp of stomach and duodenum: Secondary | ICD-10-CM | POA: Diagnosis not present

## 2020-05-05 MED ORDER — SODIUM CHLORIDE 0.9 % IV SOLN: 500.0000 mL | INTRAVENOUS | Status: AC

## 2020-05-05 NOTE — Op Note (Signed)
[facility] Patient Name: Mary Boyle Procedure Date: 05/05/2020 2:59 PM MRN: [id number] Endoscopist: Justice Britain , MD Age: 45 Referring MD:  Date of Birth: [birth year] Gender: Female Account #: [id number] Procedure:                Upper GI endoscopy Indications:              Abdominal bloating, Change in bowel habits Medicines:                Monitored Anesthesia Care Procedure:                Pre-Anesthesia Assessment:                           - Prior to the procedure, a History and Physical                            was performed, and patient medications and                            allergies were reviewed. The patient's tolerance of                            previous anesthesia was also reviewed. The risks                            and benefits of the procedure and the sedation                            options and risks were discussed with the patient.                            All questions were answered, and informed consent                            was obtained. Prior Anticoagulants: The patient has                            taken no previous anticoagulant or antiplatelet                            agents. ASA Grade Assessment: II - A patient with                            mild systemic disease. After reviewing the risks                            and benefits, the patient was deemed in                            satisfactory condition to undergo the procedure.                           After obtaining informed consent, the endoscope was  passed under direct vision. Throughout the                            procedure, the patient's blood pressure, pulse, and                            oxygen saturations were monitored continuously. The                            Endoscope was introduced through the mouth, and                            advanced to the second part of duodenum. The upper                            GI  endoscopy was accomplished without difficulty.                            The patient tolerated the procedure. Scope In: Scope Out: Findings:                 No gross lesions were noted in the entire esophagus.                           The Z-line was regular and was found 39 cm from the                            incisors.                           A single 5 mm semi-sessile polyp was found in the                            gastric fundus. Biopsies were taken with a cold                            forceps for histology.                           Patchy mildly erythematous mucosa without bleeding                            was found in the gastric fundus.                           No other gross lesions were noted in the entire                            examined stomach. Biopsies were taken with a cold                            forceps for histology and Helicobacter pylori  testing.                           No gross lesions were noted in the duodenal bulb,                            in the first portion of the duodenum and in the                            second portion of the duodenum. Biopsies were taken                            with a cold forceps for histology. Complications:            No immediate complications. Estimated Blood Loss:     Estimated blood loss was minimal. Impression:               - No gross lesions in esophagus.                           - Z-line regular, 39 cm from the incisors.                           - A single gastric polyp. Biopsied.                           - Erythematous mucosa in the gastric fundus. No                            other gross lesions in the stomach. Biopsied.                           - No gross lesions in the duodenal bulb, in the                            first portion of the duodenum and in the second                            portion of the duodenum. Biopsied. Recommendation:           - Proceed to  scheduled colonoscopy.                           - Continue present medications.                           - Await pathology results.                           - The findings and recommendations were discussed                            with the patient.                           - The findings and recommendations were discussed  with the patient's family. Justice Britain, MD 05/05/2020 3:56:45 PM

## 2020-05-05 NOTE — Progress Notes (Signed)
Report to PACU, RN, vss, BBS= Clear.  

## 2020-05-05 NOTE — Progress Notes (Signed)
Called to room to assist during endoscopic procedure.  Patient ID and intended procedure confirmed with present staff. Received instructions for my participation in the procedure from the performing physician.  

## 2020-05-05 NOTE — Patient Instructions (Addendum)
YOU HAD AN ENDOSCOPIC PROCEDURE TODAY AT [facility]:   Refer to the procedure report that was given to you for any specific questions about what was found during the examination.  If the procedure report does not answer your questions, please call your gastroenterologist to clarify.  If you requested that your care partner not be given the details of your procedure findings, then the procedure report has been included in a sealed envelope for you to review at your convenience later.  YOU SHOULD EXPECT: Some feelings of bloating in the abdomen. Passage of more gas than usual.  Walking can help get rid of the air that was put into your GI tract during the procedure and reduce the bloating. If you had a lower endoscopy (such as a colonoscopy or flexible sigmoidoscopy) you may notice spotting of blood in your stool or on the toilet paper. If you underwent a bowel prep for your procedure, you may not have a normal bowel movement for a few days.  Please Note:  You might notice some irritation and congestion in your nose or some drainage.  This is from the oxygen used during your procedure.  There is no need for concern and it should clear up in a day or so.  SYMPTOMS TO REPORT IMMEDIATELY:   Following lower endoscopy (colonoscopy or flexible sigmoidoscopy):  Excessive amounts of blood in the stool  Significant tenderness or worsening of abdominal pains  Swelling of the abdomen that is new, acute  Fever of 100F or higher   Following upper endoscopy (EGD)  Vomiting of blood or coffee ground material  New chest pain or pain under the shoulder blades  Painful or persistently difficult swallowing  New shortness of breath  Fever of 100F or higher  Black, tarry-looking stools  For urgent or emergent issues, a gastroenterologist can be reached at any hour by calling [phone]. Do not use MyChart messaging for urgent concerns.    DIET:  We do recommend a small meal at first, but  then you may proceed to your regular diet.  Drink plenty of fluids but you should avoid alcoholic beverages for 24 hours. Follow a High Fiber Diet (see handouts given to you by your recovery nurse).  MEDICATIONS: Continue present medications. Use FiberCon 1-2 tablets by mouth daily.  Follow Up: Dr. Rush Landmark recommends consideration of SIBO breath testing to further evaluate your issues of bloating pending pathology results. His office nurse will call to schedule this appointment.  Please see handouts given to you by your recovery nurse.  ACTIVITY:  You should plan to take it easy for the rest of today and you should NOT DRIVE or use heavy machinery until tomorrow (because of the sedation medicines used during the test).    FOLLOW UP: Our staff will call the number listed on your records 48-72 hours following your procedure to check on you and address any questions or concerns that you may have regarding the information given to you following your procedure. If we do not reach you, we will leave a message.  We will attempt to reach you two times.  During this call, we will ask if you have developed any symptoms of COVID 19. If you develop any symptoms (ie: fever, flu-like symptoms, shortness of breath, cough etc.) before then, please call [phone].  If you test positive for Covid 19 in the 2 weeks post procedure, please call and report this information to [place].    If any biopsies were  taken you will be contacted by phone or by letter within the next 1-3 weeks.  Please call us at [phone] if you have not heard about the biopsies in 3 weeks.   Thank you for allowing [place] to provide for your healthcare needs today.   SIGNATURES/CONFIDENTIALITY: You and/or your care partner have signed paperwork which will be entered into your electronic medical record.  These signatures attest to the fact that that the information above on your After Visit Summary has been reviewed and is understood.  Full  responsibility of the confidentiality of this discharge information lies with you and/or your care-partner.

## 2020-05-05 NOTE — Op Note (Signed)
[facility] Patient Name: Mary Boyle Procedure Date: 05/05/2020 2:59 PM MRN: [id number] Endoscopist: Justice Britain , MD Age: 45 Referring MD:  Date of Birth: [birth year] Gender: Female Account #: [id number] Procedure:                Colonoscopy Indications:              Change in bowel habits, Bloating Medicines:                Monitored Anesthesia Care Procedure:                Pre-Anesthesia Assessment:                           - Prior to the procedure, a History and Physical                            was performed, and patient medications and                            allergies were reviewed. The patient's tolerance of                            previous anesthesia was also reviewed. The risks                            and benefits of the procedure and the sedation                            options and risks were discussed with the patient.                            All questions were answered, and informed consent                            was obtained. Prior Anticoagulants: The patient has                            taken no previous anticoagulant or antiplatelet                            agents. ASA Grade Assessment: II - A patient with                            mild systemic disease. After reviewing the risks                            and benefits, the patient was deemed in                            satisfactory condition to undergo the procedure.                           After obtaining informed consent, the colonoscope  was passed under direct vision. Throughout the                            procedure, the patient's blood pressure, pulse, and                            oxygen saturations were monitored continuously. The                            Olympus [id number]-[id number] (#[id number]) Colonoscope was                            introduced through the anus and advanced to the 5                            cm into the ileum. The  colonoscopy was somewhat                            difficult due to a tortuous colon. Successful                            completion of the procedure was aided by changing                            the patient's position, using manual pressure,                            straightening and shortening the scope to obtain                            bowel loop reduction and using scope torsion. The                            patient tolerated the procedure. The quality of the                            bowel preparation was good. The terminal ileum,                            ileocecal valve, appendiceal orifice, and rectum                            were photographed. Scope In: 3:28:31 PM Scope Out: 3:45:57 PM Scope Withdrawal Time: 0 hours 8 minutes 28 seconds  Total Procedure Duration: 0 hours 17 minutes 26 seconds  Findings:                 The digital rectal exam findings include                            hemorrhoids. Pertinent negatives include no                            palpable rectal lesions.  The terminal ileum and ileocecal valve appeared                            normal.                           Normal mucosa was found in the entire colon.                           Non-bleeding non-thrombosed external and internal                            hemorrhoids were found during retroflexion, during                            perianal exam and during digital exam. The                            hemorrhoids were Grade II (internal hemorrhoids                            that prolapse but reduce spontaneously). Complications:            No immediate complications. Estimated Blood Loss:     Estimated blood loss was minimal. Impression:               - Hemorrhoids found on digital rectal exam.                           - The examined portion of the ileum was normal.                           - Normal mucosa in the entire examined colon.                            - Non-bleeding non-thrombosed external and internal                            hemorrhoids. Recommendation:           - The patient will be observed post-procedure,                            until all discharge criteria are met.                           - Discharge patient to home.                           - Patient has a contact number available for                            emergencies. The signs and symptoms of potential                            delayed complications were discussed with the  patient. Return to normal activities tomorrow.                            Written discharge instructions were provided to the                            patient.                           - High fiber diet.                           - Use FiberCon 1-2 tablets PO daily.                           - Continue present medications.                           - Await pathology results.                           - Repeat colonoscopy in 10 years for screening                            purposes.                           - Recommend consideration of SIBO breath testing to                            further evaluate issues of bloating                           - The findings and recommendations were discussed                            with the patient.                           - The findings and recommendations were discussed                            with the patient's family. Justice Britain, MD 05/05/2020 4:01:35 PM

## 2020-05-07 ENCOUNTER — Telehealth: Payer: Self-pay | Admitting: *Deleted

## 2020-05-07 ENCOUNTER — Telehealth: Payer: Self-pay

## 2020-05-07 NOTE — Telephone Encounter (Signed)
Voicemail is full and unable to leave message on follow up call.

## 2020-05-07 NOTE — Telephone Encounter (Signed)
  Follow up Call-  Call back number 05/05/2020  Post procedure Call Back phone  # [phone]  Permission to leave phone message No  Some recent data might be hidden     Patient questions:  Do you have a fever, pain , or abdominal swelling? No. Pain Score  0 *  Have you tolerated food without any problems? Yes.    Have you been able to return to your normal activities? Yes.    Do you have any questions about your discharge instructions: Diet   No. Medications  No. Follow up visit  No.  Do you have questions or concerns about your Care? No.  Actions: * If pain score is 4 or above: No action needed, pain <4.  1. Have you developed a fever since your procedure? no  2.   Have you had an respiratory symptoms (SOB or cough) since your procedure? no  3.   Have you tested positive for COVID 19 since your procedure no  4.   Have you had any family members/close contacts diagnosed with the COVID 19 since your procedure?  no   If yes to any of these questions please route to Joylene John, RN and Joella Prince, RN

## 2020-05-12 ENCOUNTER — Encounter: Payer: Self-pay | Admitting: Gastroenterology

## 2020-05-13 ENCOUNTER — Ambulatory Visit: Payer: Managed Care, Other (non HMO) | Admitting: Dermatology

## 2020-05-19 ENCOUNTER — Other Ambulatory Visit: Payer: Self-pay

## 2020-05-19 MED ORDER — DEXLANSOPRAZOLE 60 MG PO CPDR: 60.0000 mg | DELAYED_RELEASE_CAPSULE | Freq: Every day | ORAL | 3 refills | Status: DC

## 2020-07-27 ENCOUNTER — Encounter: Payer: Self-pay | Admitting: Podiatrist

## 2020-07-27 ENCOUNTER — Other Ambulatory Visit: Payer: Self-pay

## 2020-07-27 ENCOUNTER — Ambulatory Visit (INDEPENDENT_AMBULATORY_CARE_PROVIDER_SITE_OTHER): Payer: Managed Care, Other (non HMO)

## 2020-07-27 ENCOUNTER — Ambulatory Visit (INDEPENDENT_AMBULATORY_CARE_PROVIDER_SITE_OTHER): Payer: Managed Care, Other (non HMO) | Admitting: Podiatrist

## 2020-07-27 DIAGNOSIS — M67472 Ganglion, left ankle and foot: Secondary | ICD-10-CM

## 2020-07-27 DIAGNOSIS — M79675 Pain in left toe(s): Secondary | ICD-10-CM

## 2020-07-27 NOTE — Patient Instructions (Signed)
https://www.foothealthfacts.org/conditions/ganglion-cyst"> https://www.clinicalkey.com">  Ganglion Cyst  A ganglion cyst is a non-cancerous, fluid-filled lump of tissue that occurs near a joint, tendon, or ligament. The cyst grows out of a joint or the lining of a tendon or ligament. Ganglion cysts most often develop in the hand or wrist, but they can also develop in the shoulder, elbow, hip, knee, ankle, or foot. Ganglion cysts are ball-shaped or egg-shaped. Their size can range from the size of a pea to larger than a grape. Increased activity may cause the cyst to get bigger because more fluid starts to build up. What are the causes? The exact cause of this condition is not known, but it may be related to:  Inflammation or irritation around the joint.  An injury or tear in the layers of tissue around the joint (joint capsule).  Repetitive movements or overuse.  History of acute or repeated injury. What increases the risk? You are more likely to develop this condition if:  You are a female.  You are 20-40 years old. What are the signs or symptoms? The main symptom of this condition is a lump. It most often appears on the hand or wrist. In many cases, there are no other symptoms, but a cyst can sometimes cause:  Tingling.  Pain or tenderness.  Numbness.  Weakness or loss of strength in the affected joint.  Decreased range of motion in the affected area of the body.   How is this diagnosed? Ganglion cysts are usually diagnosed based on a physical exam. Your health care provider will feel the lump and may shine a light next to it. If it is a ganglion cyst, the light will likely shine through it. Your health care provider may order an X-ray, ultrasound, MRI, or CT scan to rule out other conditions. How is this treated? Ganglion cysts often go away on their own without treatment. If you have pain or other symptoms, treatment may be needed. Treatment is also needed if the ganglion  cyst limits your movement or if it gets infected. Treatment may include:  Wearing a brace or splint on your wrist or finger.  Taking anti-inflammatory medicine.  Having fluid drained from the lump with a needle (aspiration).  Getting an injection of medicine into the joint to decrease inflammation. This may be corticosteroids, ethanol, or hyaluronidase.  Having surgery to remove the ganglion cyst.  Placing a pad in your shoe or wearing shoes that will not rub against the cyst if it is on your foot. Follow these instructions at home:  Do not press on the ganglion cyst, poke it with a needle, or hit it.  Take over-the-counter and prescription medicines only as told by your health care provider.  If you have a brace or splint: ? Wear it as told by your health care provider. ? Remove it as told by your health care provider. Ask if you need to remove it when you take a shower or a bath.  Watch your ganglion cyst for any changes.  Keep all follow-up visits as told by your health care provider. This is important. Contact a health care provider if:  Your ganglion cyst becomes larger or more painful.  You have pus coming from the lump.  You have weakness or numbness in the affected area.  You have a fever or chills. Get help right away if:  You have a fever and have any of these in the cyst area: ? Increased redness. ? Red streaks. ? Swelling. Summary  A   ganglion cyst is a non-cancerous, fluid-filled lump that occurs near a joint, tendon, or ligament.  Ganglion cysts most often develop in the hand or wrist, but they can also develop in the shoulder, elbow, hip, knee, ankle, or foot.  Ganglion cysts often go away on their own without treatment. This information is not intended to replace advice given to you by your health care provider. Make sure you discuss any questions you have with your health care provider. Document Revised: 07/03/2019 Document Reviewed:  07/03/2019 Elsevier Patient Education  2021 [employer]  

## 2020-08-01 NOTE — Progress Notes (Signed)
Chief Complaint  Patient presents with  . Toe Pain    Left great toe pain x 1 months. Pain located between joints. No edema, redness or numbness.      HPI: Patient is 45 y.o. female who presents today for the concerns as listed above.  She relates she has had pain at the interphalangeal joint of the left great toe and feels there may be a cyst present.  She has been diagnosed with a cyst in her hand prior.   Patient Active Problem List   Diagnosis Date Noted  . Low serum alkaline phosphatase 12/12/2019  . Change in bowel habits 12/04/2019  . Bloating 12/04/2019  . Other irritable bowel syndrome 12/04/2019    Current Outpatient Medications on File Prior to Visit  Medication Sig Dispense Refill  . ascorbic acid (VITAMIN C) 1000 MG tablet 1 tablet    . clindamycin (CLEOCIN T) 1 % lotion Apply topically 2 (two) times daily.    . ergocalciferol (VITAMIN D2) 1.25 MG (50000 UT) capsule Take by mouth.    . Multiple Vitamins-Minerals (MULTI FOR HER) TABS 1 tablet    . Omega 3-6-9 Fatty Acids (OMEGA DHA) CHEW 1 tablet    . Omega-3 1000 MG CAPS Take by mouth.    . Turmeric 400 MG CAPS 2 capsules    . vitamin B-12 (CYANOCOBALAMIN) 1000 MCG tablet Take 1,000 mcg by mouth daily.    . vitamin E 1000 UNIT capsule 1 capsule     Current Facility-Administered Medications on File Prior to Visit  Medication Dose Route Frequency Provider Last Rate Last Admin  . 0.9 %  sodium chloride infusion  500 mL Intravenous Continuous Mansouraty, Telford Nab., MD        Allergies  Allergen Reactions  . No Known Allergies     Review of Systems No fevers, chills, nausea, muscle aches, no difficulty breathing, no calf pain, no chest pain or shortness of breath.   Physical Exam  GENERAL APPEARANCE: Alert, conversant. Appropriately groomed. No acute distress.   VASCULAR: Pedal pulses palpable DP and PT bilateral.  Capillary refill time is immediate to all digits,  Proximal to distal cooling it warm to warm.   Digital perfusion adequate.   NEUROLOGIC: sensation is intact to 5.07 monofilament at 5/5 sites bilateral.  Light touch is intact bilateral, vibratory sensation intact bilateral  MUSCULOSKELETAL: acceptable muscle strength, tone and stability bilateral.  No gross boney pedal deformities noted.  No pain, crepitus or limitation noted with foot and ankle range of motion bilateral.  Discomfort with plantarflexion of the hallux interphalangeal joint on the medial aspect of the joint noted.  Some swelling of the soft tissue in this area noted. No obvious cyst identified clinically.    DERMATOLOGIC: skin is warm, supple, and dry.  No open lesions noted.  No rash, no pre ulcerative lesions. Digital nails are asymptomatic.    xrays are negative for any acute osseous abnormalities.  No identifiable soft tissue lesion or swelling within the hallux interphalangeal joint is noted. No arthritic changes within the joint are seen.  Good alignment of the joint is noted.   Assessment     ICD-10-CM   1. Toe pain, left  M79.675 DG Foot 2 Views Left  2. 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  No guarantees were given.  The patient would like to try the steroid injection.   - a sterile skin prep was applied.  An injection consisting of dexamethasone and marcaine mixture was infiltrated into the medial hallux ip joint on the left foot.  The patient tolerated this well and was given instructions for aftercare.  - she will see how this does and call if any concerns arise

## 2020-08-18 ENCOUNTER — Other Ambulatory Visit: Payer: Self-pay

## 2020-08-18 ENCOUNTER — Ambulatory Visit
Admission: RE | Admit: 2020-08-18 | Discharge: 2020-08-18 | Disposition: A | Discharge status: Home / Self Care | Payer: Managed Care, Other (non HMO) | Source: Ambulatory Visit | Attending: Obstetrics and Gynecology | Admitting: Obstetrics and Gynecology

## 2020-08-18 ENCOUNTER — Other Ambulatory Visit: Payer: Self-pay | Admitting: Obstetrics and Gynecology

## 2020-08-18 DIAGNOSIS — N6310 Unspecified lump in the right breast, unspecified quadrant: Secondary | ICD-10-CM

## 2021-02-16 ENCOUNTER — Ambulatory Visit
Admission: RE | Admit: 2021-02-16 | Discharge: 2021-02-16 | Disposition: A | Discharge status: Home / Self Care | Payer: Managed Care, Other (non HMO) | Source: Ambulatory Visit | Attending: Obstetrics and Gynecology | Admitting: Obstetrics and Gynecology

## 2021-02-16 ENCOUNTER — Other Ambulatory Visit: Payer: Self-pay

## 2021-02-16 ENCOUNTER — Ambulatory Visit (INDEPENDENT_AMBULATORY_CARE_PROVIDER_SITE_OTHER): Payer: Managed Care, Other (non HMO) | Admitting: Dermatology

## 2021-02-16 ENCOUNTER — Encounter: Payer: Self-pay | Admitting: Dermatology

## 2021-02-16 DIAGNOSIS — N6310 Unspecified lump in the right breast, unspecified quadrant: Secondary | ICD-10-CM

## 2021-02-16 DIAGNOSIS — Z1283 Encounter for screening for malignant neoplasm of skin: Secondary | ICD-10-CM | POA: Diagnosis not present

## 2021-02-16 DIAGNOSIS — D2339 Other benign neoplasm of skin of other parts of face: Secondary | ICD-10-CM | POA: Diagnosis not present

## 2021-02-16 DIAGNOSIS — D369 Benign neoplasm, unspecified site: Secondary | ICD-10-CM

## 2021-02-16 DIAGNOSIS — D1801 Hemangioma of skin and subcutaneous tissue: Secondary | ICD-10-CM | POA: Diagnosis not present

## 2021-03-02 ENCOUNTER — Encounter: Payer: Self-pay | Admitting: Dermatology

## 2021-03-02 NOTE — Progress Notes (Signed)
   Follow-Up Visit   Subjective  Mary Boyle is a 45 y.o. female who presents for the following: Annual Exam (Spot on left nare/ tip of nose).  General skin examination, recheck bump on tip of nose Location:  Duration:  Quality:  Associated Signs/Symptoms: Modifying Factors:  Severity:  Timing: Context:   Objective  Well appearing patient in no apparent distress; mood and affect are within normal limits. Mid Back Full body skin exam.  No atypical pigmented lesions.  Right Abdomen (side) - Upper Smooth 3 mm red dermal papule.  Mid Tip of Nose To millimeter of flesh-colored nonulcerated domed papule, historically no bleeding or known growths.  Dermoscopy shows no atypical vessels.  Clinically typical for fibrous papule of the nose.    A full examination was performed including scalp, head, eyes, ears, nose, lips, neck, chest, axillae, abdomen, back, buttocks, bilateral upper extremities, bilateral lower extremities, hands, feet, fingers, toes, fingernails, and toenails. All findings within normal limits unless otherwise noted below.Areas beneath undergarments not fully examined.   Assessment & Plan    Encounter for screening for malignant neoplasm of skin Mid Back  Yearly skin exam.  Self examine with spouse twice annually.  Continue ultraviolet protection.  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   Angiofibroma Mid Tip of Nose  I have to take confirmatory biopsy to rule out early [facility] but patient content to leave the spot unless there is growth or bleeding.      I, Lavonna Monarch, MD, have reviewed all documentation for this visit.  The documentation on 03/02/21 for the exam, diagnosis, procedures, and orders are all accurate and complete.

## 2021-03-12 DIAGNOSIS — Z820 Family history of epilepsy and other diseases of the nervous system: Secondary | ICD-10-CM | POA: Insufficient documentation

## 2021-06-28 ENCOUNTER — Other Ambulatory Visit: Payer: Self-pay

## 2021-06-28 DIAGNOSIS — I8393 Asymptomatic varicose veins of bilateral lower extremities: Secondary | ICD-10-CM

## 2021-06-28 DIAGNOSIS — K5900 Constipation, unspecified: Secondary | ICD-10-CM | POA: Insufficient documentation

## 2021-06-29 ENCOUNTER — Other Ambulatory Visit: Payer: Self-pay

## 2021-06-29 ENCOUNTER — Ambulatory Visit (HOSPITAL_COMMUNITY)
Admission: RE | Admit: 2021-06-29 | Discharge: 2021-06-29 | Disposition: A | Discharge status: Home / Self Care | Payer: Managed Care, Other (non HMO) | Source: Ambulatory Visit | Attending: Vascular Surgery | Admitting: Vascular Surgery

## 2021-06-29 ENCOUNTER — Encounter: Payer: Self-pay | Admitting: Vascular Surgery

## 2021-06-29 ENCOUNTER — Ambulatory Visit (INDEPENDENT_AMBULATORY_CARE_PROVIDER_SITE_OTHER): Payer: Managed Care, Other (non HMO) | Admitting: Vascular Surgery

## 2021-06-29 DIAGNOSIS — I872 Venous insufficiency (chronic) (peripheral): Secondary | ICD-10-CM

## 2021-06-29 DIAGNOSIS — I8393 Asymptomatic varicose veins of bilateral lower extremities: Secondary | ICD-10-CM | POA: Diagnosis present

## 2021-06-29 NOTE — Progress Notes (Signed)
Patient name: Mary Boyle MRN: [id number] DOB: June 11, 1975 Sex: female  REASON FOR CONSULT: Evaluate lower extremity varicose veins  HPI: Mary Boyle is a 46 y.o. female, with no significant past medical history that presents for evaluation of varicose veins in her bilateral lower extremities.  Patient states she has noticed prominent veins particularly on the back of her calves bilaterally for years.  She saw a specialist in [place] about 7 years ago.  Her main complaint is swelling around the ankles with some achiness at the end of the day.  Both legs are equally affected.  No previous venous interventions.  States she wears compression stockings when flying.  Past Medical History:  Diagnosis Date   Anxiety    Atypical mole 03/23/2017   moderate atypia on mid abdomen   Atypical mole 03/23/2017   moderate atypia on right outer breast   Atypical mole 03/23/2017   mild atypia on right outer back   Atypical mole 06/02/2017   moderate atypia on mid abdomen   Atypical mole 08/23/2019   moderate right upper arm  anterior    Past Surgical History:  Procedure Laterality Date   APPENDECTOMY      Family History  Problem Relation Age of Onset   Gallstones Mother    Gallbladder disease Mother    Colon cancer Neg Hx    Colon polyps Neg Hx    Esophageal cancer Neg Hx    Inflammatory bowel disease Neg Hx    Liver disease Neg Hx    Pancreatic cancer Neg Hx    Rectal cancer Neg Hx    Stomach cancer Neg Hx     SOCIAL HISTORY: Social History   Socioeconomic History   Marital status: Married    Spouse name: Not on file   Number of children: Not on file   Years of education: Not on file   Highest education level: Not on file  Occupational History   Not on file  Tobacco Use   Smoking status: Former    Types: Cigarettes    Quit date: 05/05/2014    Years since quitting: 7.1   Smokeless tobacco: Never  Vaping Use   Vaping Use: Never used  Substance and Sexual Activity    Alcohol use: Yes    Alcohol/week: 0.0 standard drinks    Comment: rarely   Drug use: No   Sexual activity: Not on file  Other Topics Concern   Not on file  Social History Narrative   Not on file   Social Determinants of Health   Financial Resource Strain: Not on file  Food Insecurity: Not on file  Transportation Needs: Not on file  Physical Activity: Not on file  Stress: Not on file  Social Connections: Not on file  Intimate Partner Violence: Not on file    Allergies  Allergen Reactions   No Known Allergies     Current Outpatient Medications  Medication Sig Dispense Refill   ergocalciferol (VITAMIN D2) 1.25 MG (50000 UT) capsule Take by mouth.     Multiple Vitamins-Minerals (MULTI FOR HER) TABS 1 tablet     Omega 3-6-9 Fatty Acids (OMEGA DHA) CHEW 1 tablet     Omega-3 1000 MG CAPS Take by mouth.     Turmeric 400 MG CAPS 2 capsules     vitamin B-12 (CYANOCOBALAMIN) 1000 MCG tablet Take 1,000 mcg by mouth daily.     vitamin E 1000 UNIT capsule 1 capsule     ascorbic acid (VITAMIN C) 1000  MG tablet 1 tablet (Patient not taking: Reported on 06/29/2021)     clindamycin (CLEOCIN T) 1 % lotion Apply topically 2 (two) times daily. (Patient not taking: Reported on 06/29/2021)     Current Facility-Administered Medications  Medication Dose Route Frequency Provider Last Rate Last Admin   0.9 %  sodium chloride infusion  500 mL Intravenous Continuous Mansouraty, Telford Nab., MD        REVIEW OF SYSTEMS:  '[X]'$  denotes positive finding, '[ ]'$  denotes negative finding Cardiac  Comments:  Chest pain or chest pressure:    Shortness of breath upon exertion:    Short of breath when lying flat:    Irregular heart rhythm:        Vascular    Pain in calf, thigh, or hip brought on by ambulation:    Pain in feet at night that wakes you up from your sleep:     Blood clot in your veins:    Leg swelling:  x       Pulmonary    Oxygen at home:    Productive cough:     Wheezing:          Neurologic    Sudden weakness in arms or legs:     Sudden numbness in arms or legs:     Sudden onset of difficulty speaking or slurred speech:    Temporary loss of vision in one eye:     Problems with dizziness:         Gastrointestinal    Blood in stool:     Vomited blood:         Genitourinary    Burning when urinating:     Blood in urine:        Psychiatric    Major depression:         Hematologic    Bleeding problems:    Problems with blood clotting too easily:        Skin    Rashes or ulcers:        Constitutional    Fever or chills:      PHYSICAL EXAM: Vitals:   06/29/21 1158  BP: 111/75  Pulse: 79  Resp: 14  Temp: 97.6 F (36.4 C)  TempSrc: Temporal  Weight: 118 lb (53.5 kg)  Height: '5\' 7"'$  (1.702 m)    GENERAL: The patient is a well-nourished female, in no acute distress. The vital signs are documented above. CARDIAC: There is a regular rate and rhythm.  VASCULAR:  Palpable DP and PT pulses bilaterally Bilateral lower extremity reticular and spider veins PULMONARY: No respiratory distress. ABDOMEN: Soft and non-tender. MUSCULOSKELETAL: There are no major deformities or cyanosis. NEUROLOGIC: No focal weakness or paresthesias are detected. SKIN: There are no ulcers or rashes noted. PSYCHIATRIC: The patient has a normal affect.  DATA:    Lower Venous Reflux Study   Patient Name:  Mary Boyle  Date of Exam:   06/29/2021  Medical Rec #: [id number]      Accession #:    [id number]  Date of Birth: [birth year]      Patient Gender: F  Patient Age:   63 years  Exam Location:  [facility]  Procedure:      VAS [place] LOWER EXTREMITY VENOUS REFLUX  Referring Phys: Monica Martinez    ---------------------------------------------------------------------------  -----     Indications: Varicosities, Pain, and Swelling.     Performing Technologist: Delorise Shiner RVT      Examination Guidelines: A  complete evaluation includes B-mode  imaging,  spectral  Doppler, color Doppler, and power Doppler as needed of all accessible  portions  of each vessel. Bilateral testing is considered an integral part of a  complete  examination. Limited examinations for reoccurring indications may be  performed  as noted. The reflux portion of the exam is performed with the patient in  reverse Trendelenburg.  Significant venous reflux is defined as >500 ms in the superficial venous  system, and >1 second in the deep venous system.      Venous Reflux Times  +--------------+---------+------+-----------+------------+--------+   RIGHT          Reflux No Reflux Reflux Time Diameter cms Comments                              Yes                                       +--------------+---------+------+-----------+------------+--------+   CFV            no                                                   +--------------+---------+------+-----------+------------+--------+   FV prox        no                                                   +--------------+---------+------+-----------+------------+--------+   FV mid         no                                                   +--------------+---------+------+-----------+------------+--------+   FV dist        no                                                   +--------------+---------+------+-----------+------------+--------+   Popliteal      no                                                   +--------------+---------+------+-----------+------------+--------+   GSV at [facility]     no                              0.501                +--------------+---------+------+-----------+------------+--------+   GSV prox thigh no                              0.433                +--------------+---------+------+-----------+------------+--------+  GSV mid thigh  no                              0.429                +--------------+---------+------+-----------+------------+--------+   GSV dist thigh             yes     >500 ms      0.457                +--------------+---------+------+-----------+------------+--------+   GSV at knee               yes     >500 ms      0.387                +--------------+---------+------+-----------+------------+--------+   GSV prox calf                                  0.306                +--------------+---------+------+-----------+------------+--------+   GSV mid calf                                   0.210                +--------------+---------+------+-----------+------------+--------+   SSV Pop Fossa  no                              0.228                +--------------+---------+------+-----------+------------+--------+   SSV prox calf  no                              0.227                +--------------+---------+------+-----------+------------+--------+   SSV mid calf                                   0.200                +--------------+---------+------+-----------+------------+--------+      +--------------+---------+------+-----------+------------+--------+   LEFT           Reflux No Reflux Reflux Time Diameter cms Comments                              Yes                                       +--------------+---------+------+-----------+------------+--------+   CFV                       yes    >1 second                          +--------------+---------+------+-----------+------------+--------+   FV prox        no                                                   +--------------+---------+------+-----------+------------+--------+  FV mid         no                                                   +--------------+---------+------+-----------+------------+--------+   FV dist        no                                                   +--------------+---------+------+-----------+------------+--------+   Popliteal      no                                                   +--------------+---------+------+-----------+------------+--------+   GSV  at [facility]     no                              0.527                +--------------+---------+------+-----------+------------+--------+   GSV prox thigh no                              0.415                +--------------+---------+------+-----------+------------+--------+   GSV mid thigh  no                              0.435                +--------------+---------+------+-----------+------------+--------+   GSV dist thigh no                              0.451                +--------------+---------+------+-----------+------------+--------+   GSV at knee    no                              0.424                +--------------+---------+------+-----------+------------+--------+   GSV prox calf                                  0.345                +--------------+---------+------+-----------+------------+--------+   GSV mid calf                                   0.255                +--------------+---------+------+-----------+------------+--------+   SSV Pop Fossa  0.354                +--------------+---------+------+-----------+------------+--------+   SSV prox calf             yes     >500 ms      0.219                +--------------+---------+------+-----------+------------+--------+   SSV mid calf   no                              0.248                +--------------+---------+------+-----------+------------+--------+        Summary:  Right:  - No evidence of deep vein thrombosis seen in the right lower extremity,  from the common femoral through the popliteal veins.  - No evidence of superficial venous thrombosis in the right lower  extremity.  - No evidence of superficial venous reflux seen in the right short  saphenous vein.  - Venous reflux is noted in the right greater saphenous vein in the thigh.     Left:  - No evidence of deep vein thrombosis seen in the left lower extremity,  from the common femoral through the popliteal veins.  - No  evidence of superficial venous thrombosis in the left lower  extremity.  - No evidence of superficial venous reflux seen in the left greater  saphenous vein.  - Venous reflux is noted in the left common femoral vein.     *See table(s) above for measurements and observations.   Assessment/Plan:  46 year old female presents with bilateral lower extremity venous insufficiency consistent with CEAP classification C3.  On exam she has mostly spider veins and reticular veins.  I discussed she is not a candidate for laser ablation given no long segment superficial reflux including no reflux at the [facility] junction.  I think her best option is conservative management and discussed exercise with leg elevation and compression.  We sized her for thigh-high compression stockings today 20-30 mm Hg for medical grade compression.  I also discussed that she could have the option of sclerotherapy for her spider and reticular veins if she decided and she will call back to schedule if she wants.   Marty Heck, MD [facility] Office: [phone]

## 2021-08-17 ENCOUNTER — Ambulatory Visit (INDEPENDENT_AMBULATORY_CARE_PROVIDER_SITE_OTHER): Payer: Managed Care, Other (non HMO)

## 2021-08-17 DIAGNOSIS — I8393 Asymptomatic varicose veins of bilateral lower extremities: Secondary | ICD-10-CM

## 2021-08-17 NOTE — Progress Notes (Signed)
Treated pt's reticular veins on BLE with Asclera 1% administered with a 27g butterfly.  Patient received a total of 6 mL. ?Pt tolerated very well. We discussed the likelihood for further treatments and all areas of concern were not addressed due to pt not wanting to do more than 3 vials today. Post treatment care instructions given on handout and verbal. Will follow prn. ? ?Photos: Yes.    Compression stockings applied: Yes.   ? ? ? ? ? ? ? ? ? ? ? ?

## 2021-09-29 ENCOUNTER — Ambulatory Visit (INDEPENDENT_AMBULATORY_CARE_PROVIDER_SITE_OTHER): Payer: Managed Care, Other (non HMO) | Admitting: Physician Assistant

## 2021-09-29 ENCOUNTER — Encounter: Payer: Self-pay | Admitting: Physician Assistant

## 2021-09-29 DIAGNOSIS — Z86018 Personal history of other benign neoplasm: Secondary | ICD-10-CM

## 2021-09-29 DIAGNOSIS — D2261 Melanocytic nevi of right upper limb, including shoulder: Secondary | ICD-10-CM | POA: Diagnosis not present

## 2021-09-29 DIAGNOSIS — D485 Neoplasm of uncertain behavior of skin: Secondary | ICD-10-CM

## 2021-09-29 DIAGNOSIS — Z1283 Encounter for screening for malignant neoplasm of skin: Secondary | ICD-10-CM | POA: Diagnosis not present

## 2021-09-29 DIAGNOSIS — L239 Allergic contact dermatitis, unspecified cause: Secondary | ICD-10-CM

## 2021-09-29 MED ORDER — ALCLOMETASONE DIPROPIONATE 0.05 % EX CREA: TOPICAL_CREAM | Freq: Two times a day (BID) | CUTANEOUS | 3 refills | Status: AC | PRN

## 2021-09-29 NOTE — Progress Notes (Signed)
   Follow-Up Visit   Subjective  Mary Boyle is a 46 y.o. female who presents for the following: Annual Exam (Here for annual skin exam. Concerns nose and abdomen. History of atypical mole.  ).Her eyes have both swollen up starting today. She was outside a lot yesterday. The [place] have brought a lot of smoke here. She uses Neutrogena and hasn't had any problems with allergies to her cream in the past. No new products.  She took a zyrtec and performed cold compresses without any improvement.    The following portions of the chart were reviewed this encounter and updated as appropriate:  Tobacco  Allergies  Meds  Problems  Med Hx  Surg Hx  Fam Hx      Objective  Well appearing patient in no apparent distress; mood and affect are within normal limits.  A full examination was performed including scalp, head, eyes, ears, nose, lips, neck, chest, axillae, abdomen, back, buttocks, bilateral upper extremities, bilateral lower extremities, hands, feet, fingers, toes, fingernails, and toenails. All findings within normal limits unless otherwise noted below.  No signs of non-mole skin cancer.   peri occular Significant edema and erythema.   Right Forearm - Posterior Bichromic dark nested macule.         Assessment & Plan  Allergic dermatitis peri occular  alclomethasone (ACLOVATE) 0.05 % cream - peri occular Apply topically 2 (two) times daily as needed (Rash).  Zyrtec two times daily by mouth.  If fails allergist referral for potential allergen.   Neoplasm of uncertain behavior of skin Right Forearm - Posterior  Skin / nail biopsy Type of biopsy: tangential   Informed consent: discussed and consent obtained   Timeout: patient name, date of birth, surgical site, and procedure verified   Procedure prep:  Patient was prepped and draped in usual sterile fashion (Non sterile) Prep type:  Chlorhexidine Anesthesia: the lesion was anesthetized in a standard fashion    Anesthetic:  1% lidocaine w/ epinephrine 1-100,000 local infiltration Instrument used: flexible razor blade   Outcome: patient tolerated procedure well   Post-procedure details: wound care instructions given    Specimen 1 - Surgical pathology Differential Diagnosis: atypia  Check Margins: YES  Screening for malignant neoplasm of skin  Yearly skin exam.    I, Maisyn Nouri, PA-C, have reviewed all documentation's for this visit.  The documentation on 09/29/21 for the exam, diagnosis, procedures and orders are all accurate and complete.

## 2021-10-04 ENCOUNTER — Encounter: Payer: Self-pay | Admitting: Physician Assistant

## 2021-10-04 ENCOUNTER — Telehealth: Payer: Self-pay | Admitting: Physician Assistant

## 2021-10-04 NOTE — Telephone Encounter (Signed)
Patient is calling for pathology results from least appointment with [facility], PA-C.

## 2021-10-04 NOTE — Telephone Encounter (Signed)
-----   Message from Lavonna Monarch, MD sent at 10/01/2021  6:19 PM EDT ----- Schedule routine follow-up to do wider shave excision.

## 2021-10-04 NOTE — Telephone Encounter (Signed)
Path to patient. Made a appointment for WS with [facility].

## 2021-10-05 ENCOUNTER — Ambulatory Visit (INDEPENDENT_AMBULATORY_CARE_PROVIDER_SITE_OTHER): Payer: Managed Care, Other (non HMO) | Admitting: Physician Assistant

## 2021-10-05 ENCOUNTER — Encounter: Payer: Self-pay | Admitting: Physician Assistant

## 2021-10-05 DIAGNOSIS — D229 Melanocytic nevi, unspecified: Secondary | ICD-10-CM

## 2021-10-05 DIAGNOSIS — D2261 Melanocytic nevi of right upper limb, including shoulder: Secondary | ICD-10-CM

## 2021-10-05 NOTE — Patient Instructions (Signed)

## 2021-10-06 ENCOUNTER — Encounter: Payer: Self-pay | Admitting: Physician Assistant

## 2021-10-06 NOTE — Progress Notes (Signed)
   Follow-Up Visit   Subjective  Mary Boyle is a 46 y.o. female who presents for the following: Procedure (Patient here today for wider shave on right forearm posterior). Healed well with scab.    The following portions of the chart were reviewed this encounter and updated as appropriate:  Tobacco  Allergies  Meds  Problems  Med Hx  Surg Hx  Fam Hx      Objective  Well appearing patient in no apparent distress; mood and affect are within normal limits.  A focused examination was performed including right forearm. Relevant physical exam findings are noted in the Assessment and Plan.  Right Forearm - Posterior Pink macule   Assessment & Plan  Atypical mole Right Forearm - Posterior  Epidermal / dermal shaving - Right Forearm - Posterior  Lesion diameter (cm):  1.3 Informed consent: discussed and consent obtained   Timeout: patient name, date of birth, surgical site, and procedure verified   Procedure prep:  Patient was prepped and draped in usual sterile fashion Prep type:  Chlorhexidine Anesthesia: the lesion was anesthetized in a standard fashion   Anesthetic:  1% lidocaine w/ epinephrine 1-100,000 local infiltration Instrument used: DermaBlade   Hemostasis achieved with: aluminum chloride   Outcome: patient tolerated procedure well   Post-procedure details: sterile dressing applied and wound care instructions given   Dressing type: petrolatum and bandage    Specimen 1 - Surgical pathology Differential Diagnosis: R/O Atypia - wider shave  [id number]  Check Margins: Yes    I, Whitney Bingaman, PA-C, have reviewed all documentation's for this visit.  The documentation on 10/06/21 for the exam, diagnosis, procedures and orders are all accurate and complete.

## 2021-10-11 ENCOUNTER — Telehealth: Payer: Self-pay | Admitting: *Deleted

## 2021-10-11 NOTE — Telephone Encounter (Signed)
-----   Message from Warren Danes, [place] sent at 10/11/2021  2:08 PM EDT ----- Clear margins

## 2021-10-11 NOTE — Telephone Encounter (Signed)
Pathology to patient.  °

## 2021-11-04 ENCOUNTER — Ambulatory Visit: Payer: Managed Care, Other (non HMO) | Admitting: Dermatology

## 2022-01-09 IMAGING — US US BREAST*R* LIMITED INC AXILLA
1 series · 6 of 6 positions shown · non-contrast
Comparison: Previous exam(s).

CLINICAL DATA: 44-year-old female with a palpable right breast lump
for approximately 3 months. The patient states this area has
decreased in size from when she first felt it, but has not
disappeared completely.

EXAM:
DIGITAL DIAGNOSTIC BILATERAL MAMMOGRAM WITH CAD AND TOMO
ULTRASOUND RIGHT BREAST

[Series 1: us breast*right* limited inc axilla · 0.05mm/px · 6 of 6 slices shown]
[im 1/6]
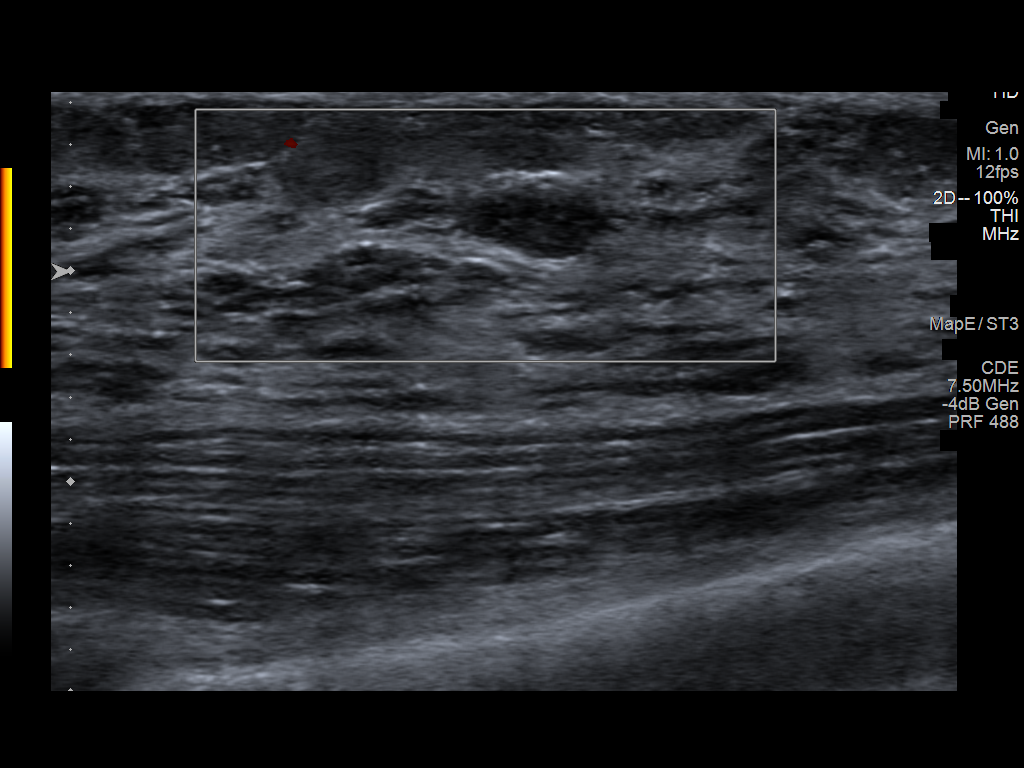
[im 2/6]
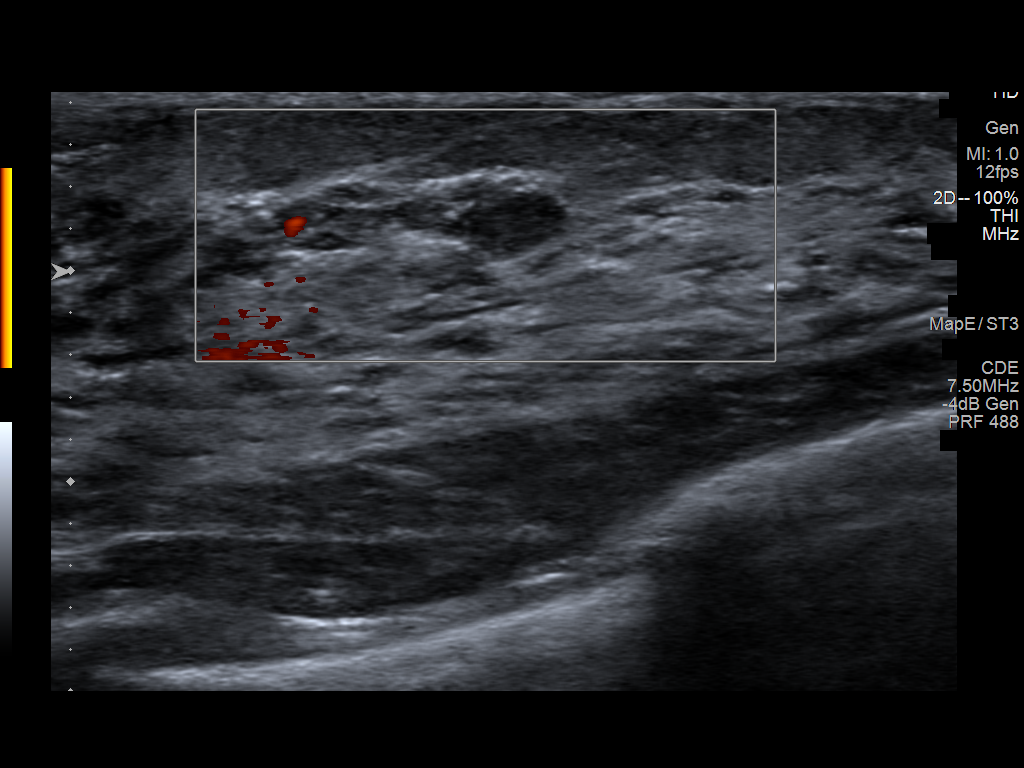
[im 3/6]
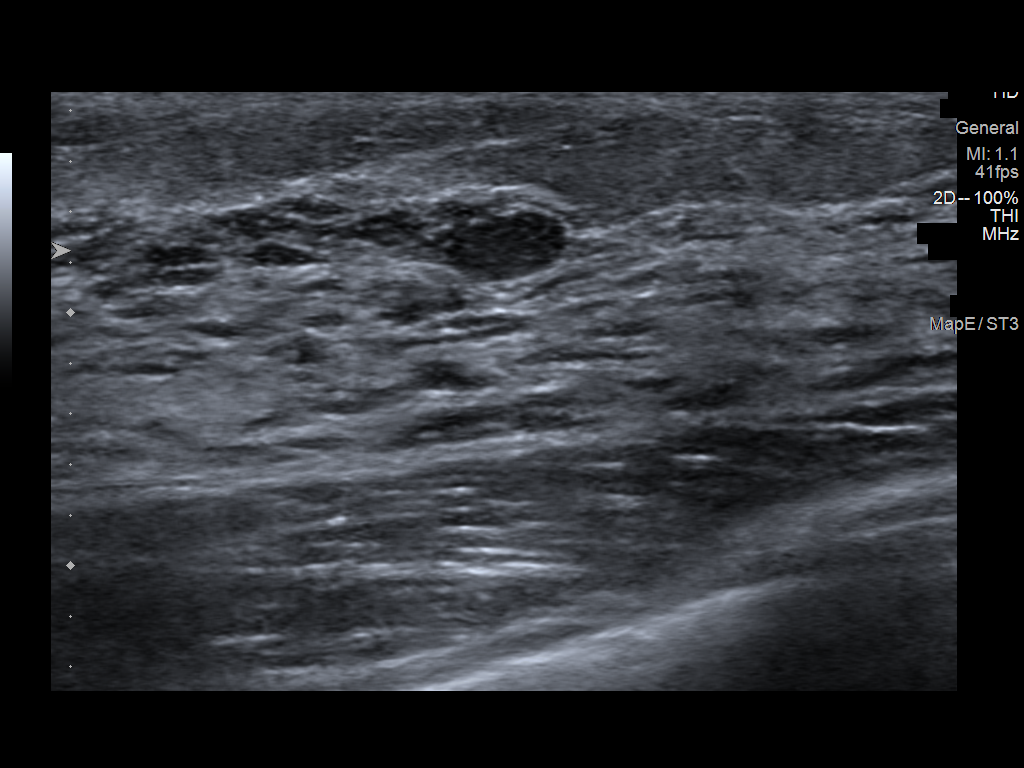
[im 4/6]
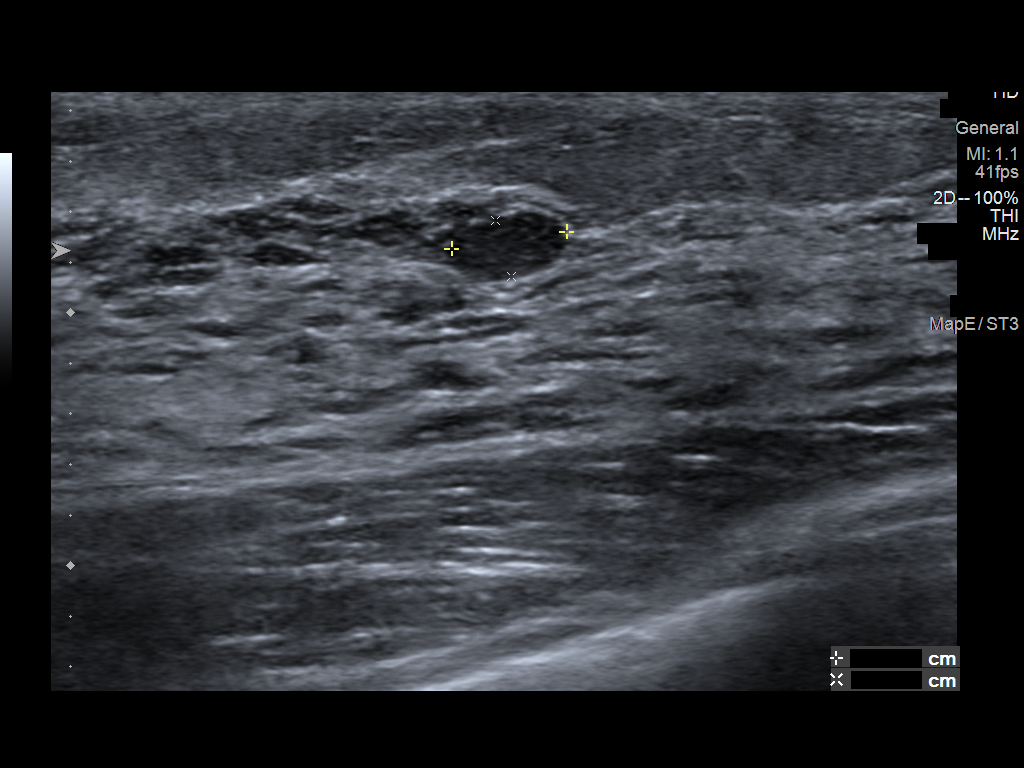
[im 5/6]
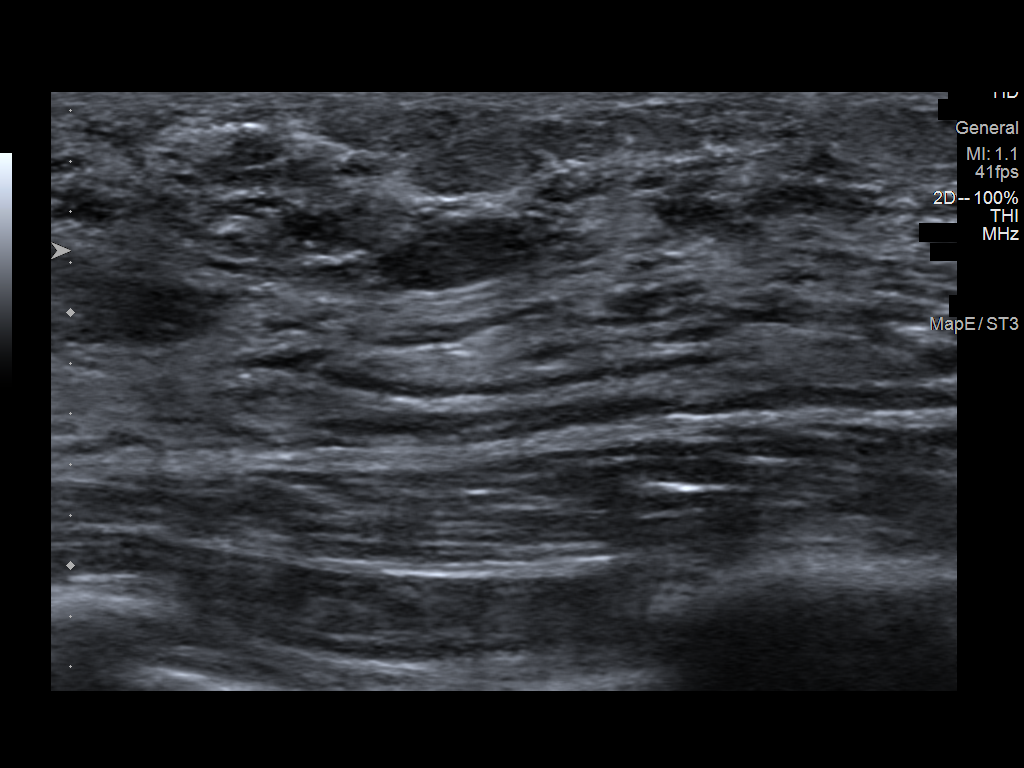
[im 6/6]
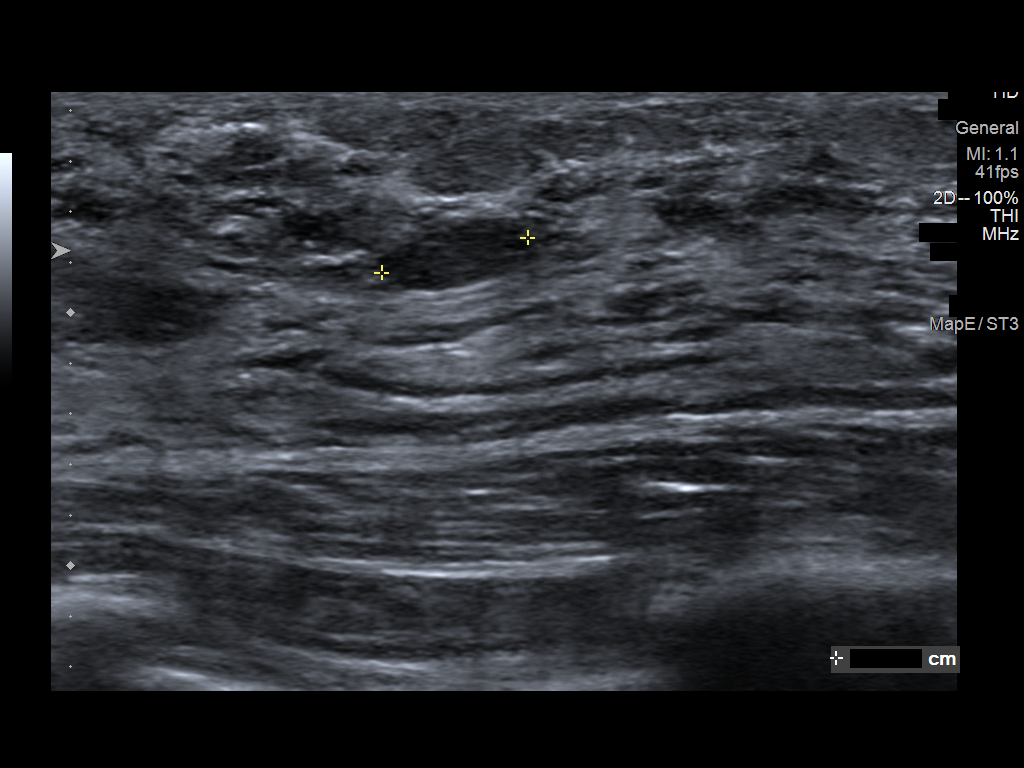

[6 of 6 positions shown; findings below may reference images not displayed]

ACR Breast Density Category d: The breast tissue is extremely dense,
which lowers the sensitivity of mammography.
FINDINGS: A radiopaque BB was placed at the site of palpable lump in the upper
outer right breast at anterior depth. No focal or suspicious
mammographic findings are seen deep to the radiopaque BB. No
suspicious mammographic findings are identified in the remainder of
either breast.

Mammographic images were processed with CAD.

Targeted ultrasound is performed, showing dense fibroglandular
tissue with an oval, circumscribed hypoechoic mass at the 11 o'clock
position 4 cm from the nipple. It measures 6 x 5 x 2 mm. There is no
associated vascularity is. This may correspond with the patient's
palpable lump. No additional suspicious findings are identified.
IMPRESSION: 1. Probably benign right breast mass. Recommendation is for
short-term imaging follow-up.
2. No mammographic evidence of malignancy on the left.

RECOMMENDATION:
Right breast ultrasound in 6 months.

I have discussed the findings and recommendations with the patient.
If applicable, a reminder letter will be sent to the patient
regarding the next appointment.

BI-RADS CATEGORY  3: Probably benign.

## 2022-01-09 IMAGING — MG DIGITAL DIAGNOSTIC BILAT W/ TOMO W/ CAD
6 of 10 series · 6 of 30 positions shown · non-contrast
Comparison: Previous exam(s).

CLINICAL DATA: 44-year-old female with a palpable right breast lump
for approximately 3 months. The patient states this area has
decreased in size from when she first felt it, but has not
disappeared completely.

EXAM:
DIGITAL DIAGNOSTIC BILATERAL MAMMOGRAM WITH CAD AND TOMO
ULTRASOUND RIGHT BREAST

[L CC synth-2D]
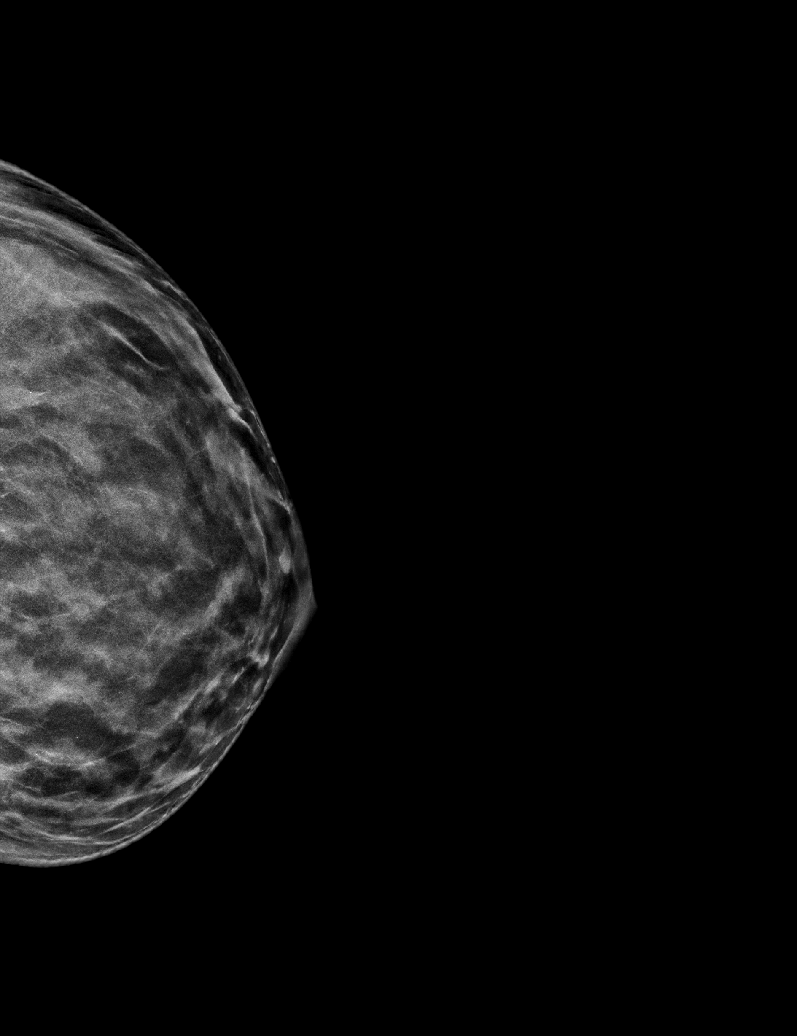

[R MLO synth-2D]
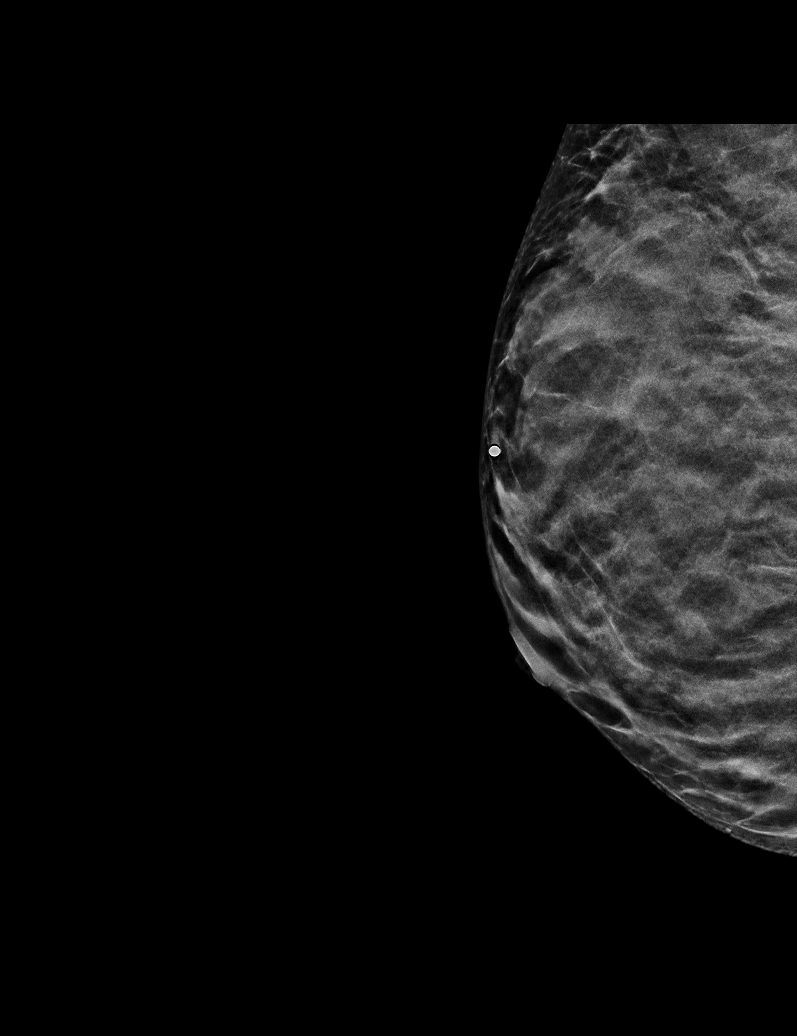

[R TAN synth-2D]
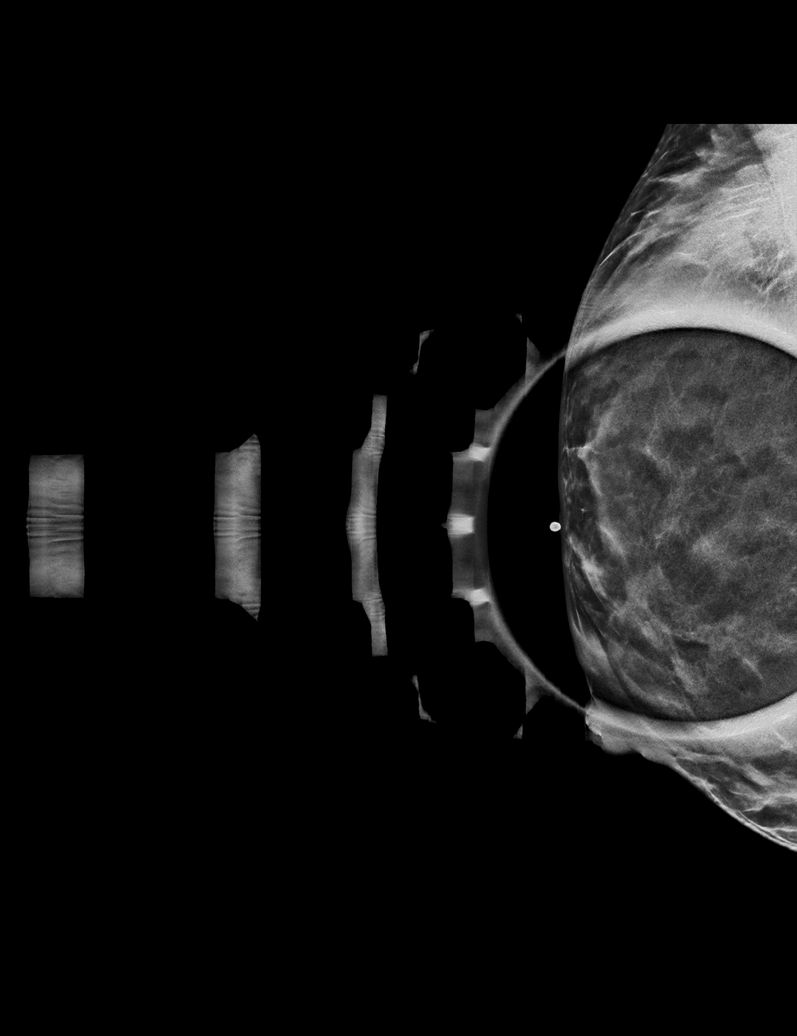

[R CC synth-2D]
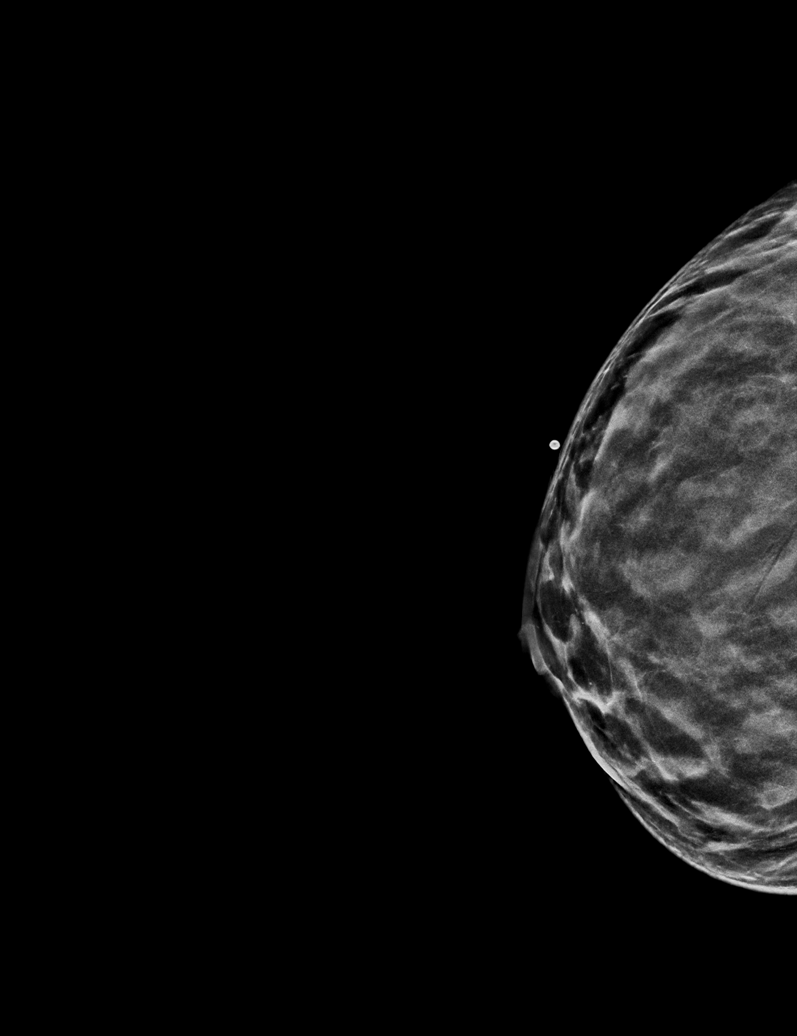

[L MLO synth-2D]
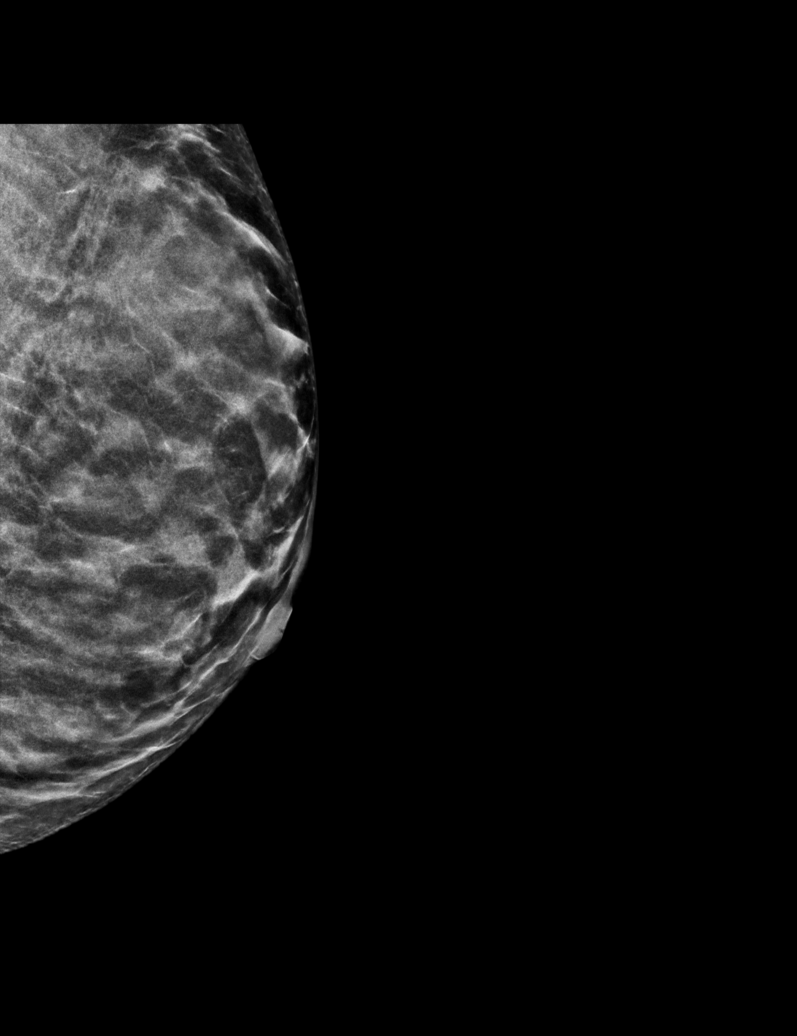

[R MLO tomo · tomo slice 22/43.0]
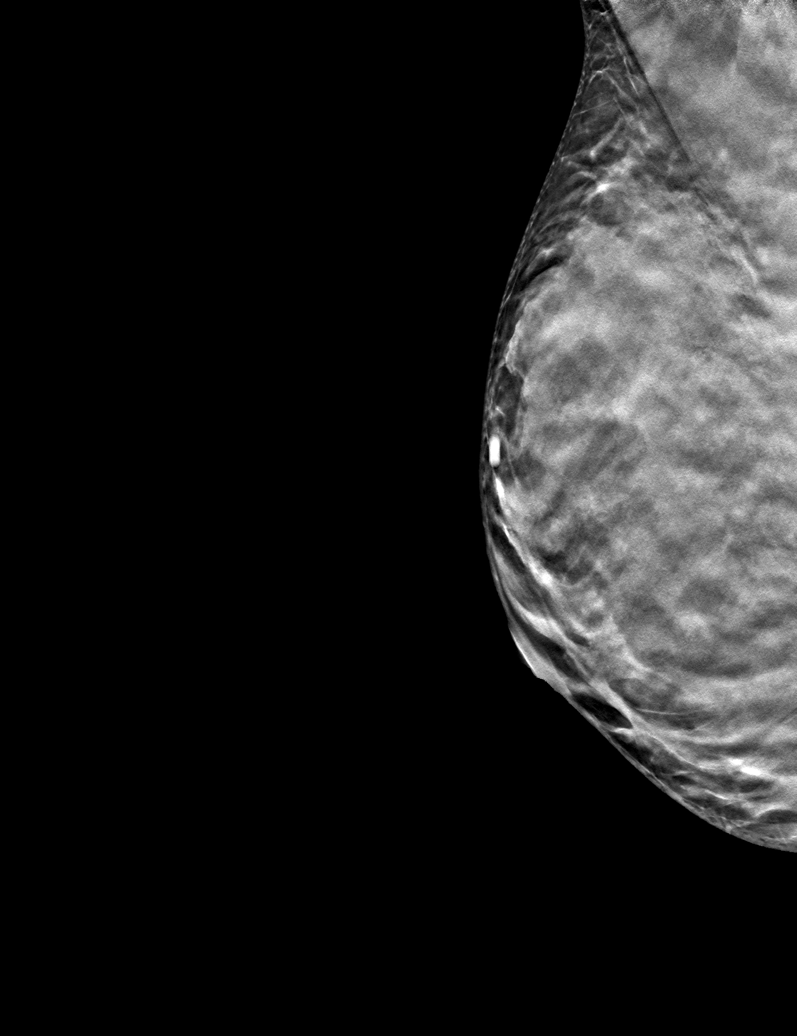

[6 of 30 positions shown; findings below may reference images not displayed]

ACR Breast Density Category d: The breast tissue is extremely dense,
which lowers the sensitivity of mammography.
FINDINGS: A radiopaque BB was placed at the site of palpable lump in the upper
outer right breast at anterior depth. No focal or suspicious
mammographic findings are seen deep to the radiopaque BB. No
suspicious mammographic findings are identified in the remainder of
either breast.

Mammographic images were processed with CAD.

Targeted ultrasound is performed, showing dense fibroglandular
tissue with an oval, circumscribed hypoechoic mass at the 11 o'clock
position 4 cm from the nipple. It measures 6 x 5 x 2 mm. There is no
associated vascularity is. This may correspond with the patient's
palpable lump. No additional suspicious findings are identified.
IMPRESSION: 1. Probably benign right breast mass. Recommendation is for
short-term imaging follow-up.
2. No mammographic evidence of malignancy on the left.

RECOMMENDATION:
Right breast ultrasound in 6 months.

I have discussed the findings and recommendations with the patient.
If applicable, a reminder letter will be sent to the patient
regarding the next appointment.

BI-RADS CATEGORY  3: Probably benign.

## 2022-02-17 ENCOUNTER — Other Ambulatory Visit: Payer: Self-pay | Admitting: Obstetrics and Gynecology

## 2022-02-17 DIAGNOSIS — R928 Other abnormal and inconclusive findings on diagnostic imaging of breast: Secondary | ICD-10-CM
